# Patient Record
Sex: Male | Born: 1955
Health system: Southern US, Community
[De-identification: ages and names within clinical notes are randomized; demographics above are authoritative.]

## PROBLEM LIST (undated history)

## (undated) DIAGNOSIS — K229 Disease of esophagus, unspecified: Secondary | ICD-10-CM

## (undated) DIAGNOSIS — C801 Malignant (primary) neoplasm, unspecified: Secondary | ICD-10-CM

## (undated) DIAGNOSIS — I10 Essential (primary) hypertension: Secondary | ICD-10-CM

## (undated) DIAGNOSIS — E7800 Pure hypercholesterolemia, unspecified: Secondary | ICD-10-CM

## (undated) DIAGNOSIS — G4733 Obstructive sleep apnea (adult) (pediatric): Secondary | ICD-10-CM

## (undated) DIAGNOSIS — N400 Enlarged prostate without lower urinary tract symptoms: Secondary | ICD-10-CM

## (undated) DIAGNOSIS — M779 Enthesopathy, unspecified: Secondary | ICD-10-CM

## (undated) DIAGNOSIS — E669 Obesity, unspecified: Secondary | ICD-10-CM

## (undated) HISTORY — DX: Obesity, unspecified: E66.9

## (undated) HISTORY — DX: Pure hypercholesterolemia, unspecified: E78.00

## (undated) HISTORY — DX: Benign prostatic hyperplasia without lower urinary tract symptoms: N40.0

## (undated) HISTORY — DX: Enthesopathy, unspecified: M77.9

## (undated) HISTORY — DX: Obstructive sleep apnea (adult) (pediatric): G47.33

## (undated) HISTORY — PX: EYE SURGERY: SHX253

## (undated) HISTORY — PX: LUNG CANCER SURGERY: SHX702

## (undated) HISTORY — DX: Essential (primary) hypertension: I10

---

## 2000-09-14 HISTORY — PX: LASIK: SHX215

## 2008-02-10 ENCOUNTER — Ambulatory Visit: Payer: Self-pay | Admitting: Unknown Physician Specialty

## 2008-02-10 DIAGNOSIS — K229 Disease of esophagus, unspecified: Secondary | ICD-10-CM

## 2008-02-10 HISTORY — DX: Disease of esophagus, unspecified: K22.9

## 2011-09-15 DIAGNOSIS — C801 Malignant (primary) neoplasm, unspecified: Secondary | ICD-10-CM

## 2011-09-15 HISTORY — DX: Malignant (primary) neoplasm, unspecified: C80.1

## 2011-11-16 ENCOUNTER — Ambulatory Visit: Payer: Self-pay | Admitting: Family Medicine

## 2011-12-15 ENCOUNTER — Ambulatory Visit: Payer: Self-pay | Admitting: Family Medicine

## 2011-12-23 ENCOUNTER — Ambulatory Visit: Payer: Self-pay | Admitting: Family Medicine

## 2011-12-23 LAB — CREATININE, SERUM
Creatinine: 0.74 mg/dL (ref 0.60–1.30)
EGFR (African American): >60
EGFR (Non-African Amer.): >60

## 2012-01-12 ENCOUNTER — Encounter: Payer: Self-pay | Admitting: Internal Medicine

## 2012-01-13 ENCOUNTER — Ambulatory Visit (INDEPENDENT_AMBULATORY_CARE_PROVIDER_SITE_OTHER): Payer: BC Managed Care – PPO | Admitting: Internal Medicine

## 2012-01-13 ENCOUNTER — Encounter: Payer: Self-pay | Admitting: Internal Medicine

## 2012-01-13 VITALS — BP 110/76 | HR 62 | Temp 97.9°F | Ht 68.0 in | Wt 228.4 lb

## 2012-01-13 DIAGNOSIS — R918 Other nonspecific abnormal finding of lung field: Secondary | ICD-10-CM | POA: Insufficient documentation

## 2012-01-13 DIAGNOSIS — J9819 Other pulmonary collapse: Secondary | ICD-10-CM

## 2012-01-13 DIAGNOSIS — I10 Essential (primary) hypertension: Secondary | ICD-10-CM

## 2012-01-13 MED ORDER — AMLODIPINE-OLMESARTAN 5-20 MG PO TABS: 1.0000 | ORAL_TABLET | Freq: Every day | ORAL | Status: DC

## 2012-01-13 NOTE — Patient Instructions (Signed)
Stop lotrel and start azor 5/20 one daily  May 14 come to outpatient registration at [facility]  for bronchoscopy for the Right middle lobe syndrome evaluaiton.  Nothing to eat or drink after midnight Monday the 13th

## 2012-01-13 NOTE — Assessment & Plan Note (Signed)
ACE inhibitors are problematic in  pts with airway complaints because  even experienced pulmonologists can't always distinguish ace effects from copd/asthma.  By themselves they don't actually cause a problem, much like oxygen can't by itself start a fire, but they certainly serve as a powerful catalyst or enhancer for any "fire"  or inflammatory process in the upper airway, be it caused by an ET  tube or more commonly reflux (especially in the obese or pts with known GERD or who are on biphoshonates).    In the era of ARB near equivalency until we have a better handle on the reversibility of the airway problem, it just makes sense to avoid ACEI  entirely in the short run and then decide later, having established a level of airway control using a reasonable limited regimen, whether to add back ace but even then being very careful to observe the pt for worsening airway control and number of meds used/ needed to control symptoms.    Will give samples of azor 5/20 for full 6 week trial off to see if symptoms of persistent cough resolve and if so return to Dr Sullivan Lone for Rx of hbp longterm - if not will need f/u here acknowledging that chronic cough is often simultaneously caused by more than one condition. A single cause has been found from 38 to 82% of the time, multiple causes from 18 to 62%. Multiply caused cough has been the result of three diseases up to 42% of the time.

## 2012-01-13 NOTE — Assessment & Plan Note (Signed)
-   See CT () 12/23/11   - FOB 01/26/12 >>>  Fairly classic story for RML syndrome with cxr /ct that may never normalize and date all the way back to early 2000's with prev walking pna, though those xrays may not be avail at this point  Discussed in detail all the  indications, usual  risks and alternatives  relative to the benefits with patient who agrees to proceed with bronchoscopy to explore the anatomy of the rml, but assured pt that in most never smokers the cause for this is completely benign.

## 2012-01-13 NOTE — Progress Notes (Signed)
  Subjective:    Patient ID: Harry Padilla, male    DOB: 10-15-1955   MRN: [id number]  HPI  32 yom never smoker ? exposed to asbestos at [employer] [facility] in 90s  referred by Dr Sullivan Lone 01/13/2012 to pulmonary clinic with abn ct chest.  01/13/2012 1st pulmonary eval  had "walking pna" around early 2000s, 100% better with no cxr's since, then acute onset cough/ congestion  around 1st Jan 2013 assoc transient hemoptysis then just clear mucus mostly daytime with minimal doe,  then 1st of March got abx > no real change. In symptoms so got cxr > ct c/w RML process so referred here.  No obvious daytime variabilty or assoc  cp or chest tightness, subjective wheeze overt sinus or hb symptoms. No unusual exp hx (was not actually exposed to any asbestos until the 90s and never in a powder/dust  form) or h/o childhood pna/ asthma or premature birth to his knowledge.   Sleeping ok without nocturnal  or early am exacerbation  of respiratory  c/o's or need for noct saba. Also denies any obvious fluctuation of symptoms with weather or environmental changes or other aggravating or alleviating factors except as outlined above   Review of Systems  Constitutional: Negative for fever, chills, activity change, appetite change and unexpected weight change.  HENT: Positive for congestion and trouble swallowing. Negative for sore throat, rhinorrhea, sneezing, dental problem, voice change and postnasal drip.   Eyes: Negative for visual disturbance.  Respiratory: Positive for cough and shortness of breath. Negative for choking.   Cardiovascular: Negative for chest pain and leg swelling.  Gastrointestinal: Negative for nausea, vomiting and abdominal pain.  Genitourinary: Negative for difficulty urinating.  Musculoskeletal: Negative for arthralgias.  Skin: Negative for rash.  Psychiatric/Behavioral: Negative for behavioral problems and confusion.       Objective:   Physical Exam  Pleasant amb wm nad Wt 228 01/13/2012  HEENT:  nl dentition, turbinates, and orophanx. Nl external ear canals without cough reflex   NECK :  without JVD/Nodes/TM/ nl carotid upstrokes bilaterally   LUNGS: no acc muscle use, clear to A and P bilaterally without cough on insp or exp maneuvers   CV:  RRR  no s3 or murmur or increase in P2, no edema   ABD:  soft and nontender with nl excursion in the supine position. No bruits or organomegaly, bowel sounds nl  MS:  warm without deformities, calf tenderness, cyanosis or clubbing  SKIN: warm and dry without lesions    NEURO:  alert, approp, no deficits   12/23/11 CT Mild r hilar fullness, ? partially obstr RML     Assessment & Plan:

## 2012-01-25 ENCOUNTER — Telehealth: Payer: Self-pay | Admitting: Internal Medicine

## 2012-01-25 NOTE — Telephone Encounter (Signed)
Dr. Sherene Sires according to ov note instructions the pt needed a bronch and you advised the following: May 14 come to outpatient registration at [facility] for bronchoscopy for the Right middle lobe syndrome evaluaiton. Nothing to eat or drink after midnight Monday the 13th, however I called [facility] and spoke with Marcelino Duster to check on the time and they do not have this scheduled. Marcelino Duster states they do have availability tomorrow AM if you would still like to do bronch. Please advise. Carron Curie, CMA

## 2012-01-25 NOTE — Telephone Encounter (Signed)
I spoke with pt and is aware of MW response. He voiced his understanding and is aware he will be called with instructions about bronch tomorrow once MW gets the time set up.

## 2012-01-25 NOTE — Telephone Encounter (Signed)
Yes, my error, I didn't call them but I will call [facility]on Jester and let pt know we'll proceed as per instructions

## 2012-01-26 ENCOUNTER — Encounter (HOSPITAL_COMMUNITY)
Admission: RE | Disposition: A | Discharge status: Home / Self Care | Payer: Self-pay | Source: Ambulatory Visit | Attending: Internal Medicine

## 2012-01-26 ENCOUNTER — Ambulatory Visit (HOSPITAL_COMMUNITY)
Admission: RE | Admit: 2012-01-26 | Discharge: 2012-01-26 | Disposition: A | Discharge status: Home / Self Care | Payer: BC Managed Care – PPO | Source: Ambulatory Visit | Attending: Internal Medicine | Admitting: Internal Medicine

## 2012-01-26 ENCOUNTER — Other Ambulatory Visit: Payer: Self-pay | Admitting: Internal Medicine

## 2012-01-26 ENCOUNTER — Encounter (HOSPITAL_COMMUNITY): Payer: Self-pay | Admitting: Internal Medicine

## 2012-01-26 DIAGNOSIS — R222 Localized swelling, mass and lump, trunk: Secondary | ICD-10-CM | POA: Insufficient documentation

## 2012-01-26 DIAGNOSIS — R918 Other nonspecific abnormal finding of lung field: Secondary | ICD-10-CM

## 2012-01-26 DIAGNOSIS — R042 Hemoptysis: Secondary | ICD-10-CM | POA: Insufficient documentation

## 2012-01-26 HISTORY — PX: VIDEO BRONCHOSCOPY: SHX5072

## 2012-01-26 SURGERY — VIDEO BRONCHOSCOPY WITHOUT FLUORO
Anesthesia: Moderate Sedation | Laterality: Bilateral

## 2012-01-26 MED ORDER — MIDAZOLAM HCL 10 MG/2ML IJ SOLN
INTRAMUSCULAR | Status: AC
  Filled 2012-01-26: qty 4

## 2012-01-26 MED ORDER — PHENYLEPHRINE HCL 0.25 % NA SOLN
NASAL | Status: DC | PRN
  Administered 2012-01-26: 2 via NASAL

## 2012-01-26 MED ORDER — MEPERIDINE HCL 100 MG/ML IJ SOLN: 100.0000 mg | Freq: Once | INTRAMUSCULAR | Status: DC

## 2012-01-26 MED ORDER — PHENYLEPHRINE HCL 0.25 % NA SOLN
1.0000 | Freq: Four times a day (QID) | NASAL | Status: DC | PRN
  Filled 2012-01-26: qty 15

## 2012-01-26 MED ORDER — LIDOCAINE HCL 2 % EX GEL: Freq: Once | CUTANEOUS | Status: DC

## 2012-01-26 MED ORDER — MEPERIDINE HCL 100 MG/ML IJ SOLN
INTRAMUSCULAR | Status: AC
  Filled 2012-01-26: qty 2

## 2012-01-26 MED ORDER — LIDOCAINE HCL 1 % IJ SOLN
INTRAMUSCULAR | Status: DC | PRN
  Administered 2012-01-26: 6 mL via RESPIRATORY_TRACT

## 2012-01-26 MED ORDER — MIDAZOLAM HCL 10 MG/2ML IJ SOLN: 10.0000 mg | Freq: Once | INTRAMUSCULAR | Status: DC

## 2012-01-26 MED ORDER — MIDAZOLAM HCL 10 MG/2ML IJ SOLN
INTRAMUSCULAR | Status: DC | PRN
  Administered 2012-01-26 (×2): 2.5 mg via INTRAVENOUS

## 2012-01-26 MED ORDER — SODIUM CHLORIDE 0.9 % IV SOLN: INTRAVENOUS | Status: DC

## 2012-01-26 MED ORDER — MEPERIDINE HCL 25 MG/ML IJ SOLN
INTRAMUSCULAR | Status: DC | PRN
  Administered 2012-01-26: 50 mg via INTRAVENOUS

## 2012-01-26 MED ORDER — LIDOCAINE HCL 2 % EX GEL
CUTANEOUS | Status: DC | PRN
  Administered 2012-01-26: 1

## 2012-01-26 NOTE — Progress Notes (Signed)
Pt placed on RA for transport to Recovery area at 0903  O2 sats at 0904 was 93%

## 2012-01-26 NOTE — Progress Notes (Signed)
Video Bronchoscopy  05/14/22013  Intervention Bronchial Washing

## 2012-01-26 NOTE — H&P (Signed)
26 yom never smoker ? exposed to asbestos at [employer] in 90s referred by Dr Sullivan Lone 01/13/2012 to pulmonary clinic with abn ct chest.  01/13/2012 1st pulmonary eval had "walking pna" around early 2000s, 100% better with no cxr's since, then acute onset cough/ congestion around 1st Jan 2013 assoc transient hemoptysis then just clear mucus mostly daytime with minimal doe, then 1st of March got abx > no real change. In symptoms so got cxr > ct c/w RML process so referred here.  No obvious daytime variabilty or assoc cp or chest tightness, subjective wheeze overt sinus or hb symptoms. No unusual exp hx (was not actually exposed to any asbestos until the 90s and never in a powder/dust form) or h/o childhood pna/ asthma or premature birth to his knowledge.  Sleeping ok without nocturnal or early am exacerbation of respiratory c/o's or need for noct saba. Also denies any obvious fluctuation of symptoms with weather or environmental changes or other aggravating or alleviating factors except as outlined above  Review of Systems  Constitutional: Negative for fever, chills, activity change, appetite change and unexpected weight change.  HENT: Positive for congestion and trouble swallowing. Negative for sore throat, rhinorrhea, sneezing, dental problem, voice change and postnasal drip.  Eyes: Negative for visual disturbance.  Respiratory: Positive for cough and shortness of breath. Negative for choking.  Cardiovascular: Negative for chest pain and leg swelling.  Gastrointestinal: Negative for nausea, vomiting and abdominal pain.  Genitourinary: Negative for difficulty urinating.  Musculoskeletal: Negative for arthralgias.  Skin: Negative for rash.  Psychiatric/Behavioral: Negative for behavioral problems and confusion.      Objective:    Physical Exam  Pleasant amb wm nad  Wt 228 01/13/2012  HEENT: nl dentition, turbinates, and orophanx. Nl external ear canals without cough reflex  NECK : without  JVD/Nodes/TM/ nl carotid upstrokes bilaterally  LUNGS: no acc muscle use, clear to A and P bilaterally without cough on insp or exp maneuvers  CV: RRR no s3 or murmur or increase in P2, no edema  ABD: soft and nontender with nl excursion in the supine position. No bruits or organomegaly, bowel sounds nl  MS: warm without deformities, calf tenderness, cyanosis or clubbing  SKIN: warm and dry without lesions  NEURO: alert, approp, no deficits  12/23/11 CT  Mild r hilar fullness, ? partially obstr RML     Assessment & Plan:     Previous Version      Right middle lobe syndrome - Sandrea Hughs, MD 01/13/2012 11:51 PM Signed  - See CT ([place]) 12/23/11  - FOB 01/26/12 >>>  Fairly classic story for RML syndrome with cxr /ct that may never normalize and date all the way back to early 2000's with prev walking pna, though those xrays may not be avail at this point  Discussed in detail all the indications, usual risks and alternatives relative to the benefits with patient who agrees to proceed with bronchoscopy to explore the anatomy of the rml, but assured pt that in most never smokers the cause for this is completely benign.  HBP (high blood pressure) - Sandrea Hughs, MD 01/13/2012 11:54 PM Signed  ACE inhibitors are problematic in pts with airway complaints because even experienced pulmonologists can't always distinguish ace effects from copd/asthma. By themselves they don't actually cause a problem, much like oxygen can't by itself start a fire, but they certainly serve as a powerful catalyst or enhancer for any "fire" or inflammatory process in the upper airway, be it  caused by an ET tube or more commonly reflux (especially in the obese or pts with known GERD or who are on biphoshonates).  In the era of ARB near equivalency until we have a better handle on the reversibility of the airway problem, it just makes sense to avoid ACEI entirely in the short run and then decide later, having established a level of  airway control using a reasonable limited regimen, whether to add back ace but even then being very careful to observe the pt for worsening airway control and number of meds used/ needed to control symptoms.  Will give samples of azor 5/20 for full 6 week trial off to see if symptoms of persistent cough resolve and if so return to Dr Sullivan Lone for Rx of hbp longterm - if not will need f/u here acknowledging that chronic cough is often simultaneously caused by more than one condition. A single cause has been found from 38 to 82% of the time, multiple causes from 18 to 62%. Multiply caused cough has been the result of three diseases up to 42% of the time.       Electronic signature on 01/13/2012          Not recorded            Medications Ordered This Encounter         Disp  Refills  Start  End    amLODipine-olmesartan (AZOR) 5-20 MG per tablet  30 tablet  11  01/13/2012      Take 1 tablet by mouth daily. - Oral             Discontinued Medications         Reason for Discontinue    amLODipine-benazepril (LOTREL) 5-40 MG per capsule                   Patient Instructions     Stop lotrel and start azor 5/20 one daily  May 14 come to outpatient registration at [facility] for bronchoscopy for the Right middle lobe syndrome evaluaiton. Nothing to eat or drink after midnight Monday the 13th

## 2012-01-26 NOTE — Discharge Instructions (Signed)
Bronchoscopy Care After These instructions give you information on caring for yourself after your procedure. Your doctor may also give you specific instructions. Call your doctor if you have any problems or questions after your procedure. HOME CARE  Do not eat or drink anything for 2 hours after your test. Your nose and throat was numbed by medicine. If you try to eat or drink before the medicine wears off, food or drink could go into your lungs.   For the rest of the first day, eat soft food and drink liquids slowly.   On the day after the test, you can go back to eating your usual food.   Do not drive or sign important papers the day of the test.   Take it easy for the next 2 days. Do not do any heavy work, exercise, or activities.   Only take medicine as told by your doctor. Do not take aspirin.   You may be drowsy for the next 24 hours.   You may see traces of blood in your spit for 1 to 2 days.  Finding out the results of your test Ask when your test results will be ready. Make sure you get your test results. GET HELP RIGHT AWAY IF:  You have breathing problems.   You have a bad sore throat for more than 1 week.   You see traces of blood in your spit for more than 3 days.   You start coughing up blood.   You have a temperature of 102 F (38.9 C) or higher.  MAKE SURE YOU:  Understand these instructions.   Will watch your condition.   Will get help right away if you are not doing well or get worse.  Document Released: 06/28/2009 Document Revised: 08/20/2011 Document Reviewed: 06/28/2009 [facility] Patient Information 2012 [employer]  STOP ALL ASPIRIN PRODUCTS

## 2012-01-26 NOTE — Op Note (Signed)
Bronchoscopy Procedure Note  Date of Operation: 01/26/2012  Pre-op Diagnosis: rml syndrome  Post-op Diagnosis: RML mass  Surgeon: Sandrea Hughs  Anesthesia: Monitored Local Anesthesia with Sedation  Operation: Video Flexible fiberoptic bronchoscopy, diagnostic   Findings: RML vascular endobronchial mass  Specimen: Bronchial lavage  Estimated Blood Loss: less than 50   Complications: None  Indications and History: The patient is a 56 y.o. male with hemoptysis.  The risks, benefits, complications, treatment options and expected outcomes were discussed with the patient.  The possibilities of reaction to medication, pulmonary aspiration, perforation of a viscus, bleeding, failure to diagnose a condition and creating a complication requiring transfusion or operation were discussed with the patient who freely signed the consent.    Description of Procedure: The patient was re-examined in the bronchoscopy suite and the site of surgery properly noted/marked.  The patient was identified as Engineer, maintenance and the procedure verified as Flexible Fiberoptic Bronchoscopy.  A Time Out was held and the above information confirmed.   After the induction of topical nasopharyngeal anesthesia, the patient was positioned  and the bronchoscope was passed through the R naris. The vocal cords were visualized and  1% buffered lidocaine 5 ml was topically placed onto the cords. The cords were nl. The scope was then passed into the trachea.  1% buffered lidocaine 5 ml was used topically on the carina.  Careful inspection of the tracheal lumen was accomplished. The scope was sequentially passed into all the airways to the subsegmental level with nl findings x in the RML bronchus.   Endobronchial findings:  RML bronchus was completely occluded with a very vascular mass actively oozing so it was washed and the procedure terminated   The Patient was taken to the Endoscopy Recovery area in satisfactory  condition.  Attestation: I performed the procedure. Sandrea Hughs, MD Pulmonary and Critical Care Medicine [facility] Cell [phone]

## 2012-01-27 ENCOUNTER — Encounter (HOSPITAL_COMMUNITY): Payer: Self-pay | Admitting: Internal Medicine

## 2012-01-27 ENCOUNTER — Encounter: Payer: Self-pay | Admitting: Internal Medicine

## 2012-01-27 ENCOUNTER — Telehealth: Payer: Self-pay | Admitting: Internal Medicine

## 2012-01-27 NOTE — Telephone Encounter (Signed)
I spoke with Harry Padilla and she states pt was wanting to know if MW will be there when pet scan is done. I advised her it's done in radiology and report will be sent to [facility] for review. She voiced her understanding and needed nothing further

## 2012-02-01 ENCOUNTER — Encounter (HOSPITAL_COMMUNITY)
Admission: RE | Admit: 2012-02-01 | Discharge: 2012-02-01 | Disposition: A | Discharge status: Home / Self Care | Payer: BC Managed Care – PPO | Source: Ambulatory Visit | Attending: Internal Medicine | Admitting: Internal Medicine

## 2012-02-01 ENCOUNTER — Encounter: Payer: Self-pay | Admitting: Internal Medicine

## 2012-02-01 DIAGNOSIS — R222 Localized swelling, mass and lump, trunk: Secondary | ICD-10-CM | POA: Insufficient documentation

## 2012-02-01 DIAGNOSIS — R918 Other nonspecific abnormal finding of lung field: Secondary | ICD-10-CM

## 2012-02-01 MED ORDER — FLUDEOXYGLUCOSE F - 18 (FDG) INJECTION
19.2000 | Freq: Once | INTRAVENOUS | Status: AC | PRN
  Administered 2012-02-01: 19.2 via INTRAVENOUS

## 2012-02-02 ENCOUNTER — Other Ambulatory Visit: Payer: Self-pay | Admitting: Internal Medicine

## 2012-02-02 DIAGNOSIS — R918 Other nonspecific abnormal finding of lung field: Secondary | ICD-10-CM

## 2012-02-03 ENCOUNTER — Inpatient Hospital Stay (HOSPITAL_COMMUNITY): Admission: RE | Admit: 2012-02-03 | Discharge: 2012-02-03 | Payer: BC Managed Care – PPO | Source: Ambulatory Visit

## 2012-02-03 ENCOUNTER — Encounter: Payer: Self-pay | Admitting: Surgery

## 2012-02-03 ENCOUNTER — Other Ambulatory Visit: Payer: Self-pay

## 2012-02-03 ENCOUNTER — Encounter (HOSPITAL_COMMUNITY)
Admission: RE | Admit: 2012-02-03 | Discharge: 2012-02-03 | Disposition: A | Discharge status: Home / Self Care | Payer: BC Managed Care – PPO | Source: Ambulatory Visit | Attending: Surgery | Admitting: Surgery

## 2012-02-03 ENCOUNTER — Institutional Professional Consult (permissible substitution) (INDEPENDENT_AMBULATORY_CARE_PROVIDER_SITE_OTHER): Payer: BC Managed Care – PPO | Admitting: Surgery

## 2012-02-03 ENCOUNTER — Encounter (HOSPITAL_COMMUNITY): Payer: Self-pay | Admitting: Pharmacy Technician

## 2012-02-03 ENCOUNTER — Encounter (HOSPITAL_COMMUNITY): Payer: Self-pay

## 2012-02-03 VITALS — BP 148/91 | HR 72 | Resp 16 | Ht 68.0 in | Wt 225.0 lb

## 2012-02-03 VITALS — BP 156/90 | HR 87 | Temp 98.8°F | Resp 20 | Ht 68.0 in | Wt 228.6 lb

## 2012-02-03 DIAGNOSIS — D381 Neoplasm of uncertain behavior of trachea, bronchus and lung: Secondary | ICD-10-CM

## 2012-02-03 DIAGNOSIS — R042 Hemoptysis: Secondary | ICD-10-CM

## 2012-02-03 DIAGNOSIS — R918 Other nonspecific abnormal finding of lung field: Secondary | ICD-10-CM

## 2012-02-03 DIAGNOSIS — R222 Localized swelling, mass and lump, trunk: Secondary | ICD-10-CM

## 2012-02-03 LAB — URINALYSIS, ROUTINE W REFLEX MICROSCOPIC
Glucose, UA: NEGATIVE mg/dL
Hgb urine dipstick: NEGATIVE
Ketones, ur: NEGATIVE mg/dL
Leukocytes, UA: NEGATIVE
Nitrite: NEGATIVE
Urobilinogen, UA: 0.2 mg/dL (ref 0.0–1.0)
pH: 6 (ref 5.0–8.0)

## 2012-02-03 LAB — CBC
HCT: 40.8 % (ref 39.0–52.0)
Hemoglobin: 14.7 g/dL (ref 13.0–17.0)
MCH: 31.1 pg (ref 26.0–34.0)
MCV: 86.3 fL (ref 78.0–100.0)
RBC: 4.73 MIL/uL (ref 4.22–5.81)

## 2012-02-03 LAB — BLOOD GAS, ARTERIAL
Acid-Base Excess: 0.4 mmol/L (ref 0.0–2.0)
Bicarbonate: 23.8 mEq/L (ref 20.0–24.0)
O2 Saturation: 96 %
TCO2: 24.9 mmol/L (ref 0–100)
pO2, Arterial: 75.5 mmHg — ABNORMAL LOW (ref 80.0–100.0)

## 2012-02-03 LAB — PROTIME-INR: INR: 0.87 (ref 0.00–1.49)

## 2012-02-03 LAB — COMPREHENSIVE METABOLIC PANEL
ALT: 24 U/L (ref 0–53)
CO2: 22 mEq/L (ref 19–32)
Calcium: 9.5 mg/dL (ref 8.4–10.5)
Creatinine, Ser: 0.83 mg/dL (ref 0.50–1.35)
GFR calc Af Amer: >90 mL/min (ref >90)
GFR calc non Af Amer: >90 mL/min (ref >90)
Glucose, Bld: 96 mg/dL (ref 70–99)
Total Bilirubin: 0.3 mg/dL (ref 0.3–1.2)

## 2012-02-03 LAB — SURGICAL PCR SCREEN: Staphylococcus aureus: POSITIVE — AB

## 2012-02-03 LAB — TYPE AND SCREEN: ABO/RH(D): O POS

## 2012-02-03 LAB — APTT: aPTT: 27 seconds (ref 24–37)

## 2012-02-03 MED ORDER — VANCOMYCIN HCL 1000 MG IV SOLR
1500.0000 mg | INTRAVENOUS | Status: AC
  Administered 2012-02-04: 1500 mg via INTRAVENOUS
  Filled 2012-02-03: qty 1500

## 2012-02-03 MED ORDER — VANCOMYCIN HCL IN DEXTROSE 1-5 GM/200ML-% IV SOLN: 1000.0000 mg | INTRAVENOUS | Status: DC

## 2012-02-03 NOTE — Progress Notes (Signed)
                  [address]            ,[place] [address]          [phone]      PCP is GILBERT,RICHARD L, MD, MD Referring Provider is Wert, Michael B, MD    Chief Complaint   Patient presents with   .  Lung Mass       eval and treat...ct/[facility]...PET        HPI:   The patient is a 56-year-old healthy nonsmoker who reports developing hemoptysis in January 2013. He said that a chest x-ray showed a right lung density and he was treated with antibiotics for possible pneumonia. The chest x-ray findings did not improve although the hemoptysis stopped. He was continuing to have some cough and shortness of breath. Over the past couple weeks the hemoptysis has resumed. He said this typically occurs in the morning and he will cough up some blood clot followed by some bright red blood which then stops. He said this is more than just blood streaking. Bronchoscopy was performed by Dr. Wert and showed complete occlusion of the RML bronchus by a very vascular mass that was actively oozing.  Biopsy was not performed but washings were and showed no malignancy. He last coughed up some blood this am.    Past Medical History   Diagnosis  Date   .  Viral pneumonia     .  Enthesopathy     .  BPH (benign prostatic hyperplasia)     .  Hypercholesterolemia     .  Hypertension     .  Obesity     .  OSA (obstructive sleep apnea)           Past Surgical History   Procedure  Date   .  Lasik     .  Video bronchoscopy  01/26/2012       Procedure: VIDEO BRONCHOSCOPY WITHOUT FLUORO;  Surgeon: Michael B Wert, MD;  Location: [facility] ENDOSCOPY;  Service: Cardiopulmonary;  Laterality: Bilateral;         Family History   Problem  Relation  Age of Onset   .  Heart disease  Mother          Social History History   Substance Use Topics   .  Smoking status:  Never Smoker    .  Smokeless tobacco:  Never Used   .  Alcohol Use:  No         Current Outpatient Prescriptions     Medication  Sig  Dispense  Refill   .  amLODipine-olmesartan (AZOR) 5-20 MG per tablet  Take 1 tablet by mouth daily.   30 tablet   11   .  cholecalciferol (VITAMIN D) 400 UNITS TABS  Take 400 Units by mouth daily.         .  hydrocortisone cream 1 %  Apply 1 application topically 2 (two) times daily.         .  Loratadine 10 MG CAPS  Take 1 capsule by mouth daily as needed.         .  meloxicam (MOBIC) 15 MG tablet  Take 15 mg by mouth daily.         .  Multiple Vitamins-Minerals (MENS MULTIPLUS PO)  Take 1 tablet by mouth daily.         .    vitamin C (ASCORBIC ACID) 250 MG tablet  Take 250 mg by mouth daily.         .  vitamin E 400 UNIT capsule  Take 400 Units by mouth daily.             No current facility-administered medications for this visit.       Facility-Administered Medications Ordered in Other Visits   Medication  Dose  Route  Frequency  Provider  Last Rate  Last Dose   .  vancomycin (VANCOCIN) 1,500 mg in sodium chloride 0.9 % 500 mL IVPB   1,500 mg  Intravenous  60 min Pre-Op  Nyheem Binette K Maizy Davanzo, MD         .  DISCONTD: vancomycin (VANCOCIN) IVPB 1000 mg/200 mL premix   1,000 mg  Intravenous  60 min Pre-Op  Ellina Sivertsen K Tyshon Fanning, MD               Allergies   Allergen  Reactions   .  Penicillins         ? rash        Review of Systems  Constitutional: Negative.   HENT: Negative.   Eyes: Negative.   Respiratory: Positive for cough, shortness of breath and wheezing. Negative for choking, chest tightness and stridor.   Genitourinary: Negative.   Musculoskeletal: Negative.   Neurological: Negative.   Hematological: Negative.   Psychiatric/Behavioral: Negative.       BP 148/91  Pulse 72  Resp 16  Ht 5' 8" (1.727 m)  Wt 225 lb (102.059 kg)  BMI 34.21 kg/m2  SpO2 95% Physical Exam  Constitutional: He is oriented to person, place, and time. He appears well-developed and well-nourished.  HENT:   Head: Normocephalic and atraumatic.   Mouth/Throat: Oropharynx is clear  and moist. No oropharyngeal exudate.  Eyes: Conjunctivae and EOM are normal.  Neck: Normal range of motion. Neck supple. No JVD present. No tracheal deviation present. No thyromegaly present.  Cardiovascular: Normal rate, regular rhythm, normal heart sounds and intact distal pulses.  Exam reveals no gallop and no friction rub.    No murmur heard. Pulmonary/Chest: Effort normal. No stridor. No respiratory distress. He has wheezes.  Abdominal: Soft. Bowel sounds are normal. He exhibits no distension and no mass. There is no tenderness.  Musculoskeletal: Normal range of motion. He exhibits no edema.  Lymphadenopathy:    He has no cervical adenopathy.  Neurological: He is alert and oriented to person, place, and time. No cranial nerve deficit or sensory deficit.  Skin: Skin is warm and dry.  Psychiatric: He has a normal mood and affect.        Diagnostic Tests:     *RADIOLOGY REPORT*   Clinical Data: Initial treatment strategy for right middle lobe lung mass.  Biopsy on 01/26/2012 demonstrating "acute inflammation and reactive bronchial epithelial cells."   NUCLEAR MEDICINE PET CT SKULL BASE TO THIGH   Technique:  Technique:  19.2 mCi F-18 FDG was injected intravenously. CT data was obtained and used for attenuation correction and anatomic localization only.  (This was not acquired as a diagnostic CT examination.) Additional exam technical data entered on technologist worksheet.   Comparison: Outside report of a chest CT dated 12/23/2011.   Findings: Neck: No abnormal hypermetabolism.   Chest:  No abnormal activity identified.  Specifically, in the region of the right middle lobe bronchus and partially collapsed posterior right middle lobe, there are no focal areas of hypermetabolism to suggest FDG avid primary neoplasm.    No abnormal nodal activity.   Abdomen/Pelvis:  No abnormal hypermetabolism.   [place]:  No abnormal hypermetabolism.   CT images performed for  attenuation correction demonstrate mucous retention cyst or polyp in the left maxillary sinus.  Small posterior triangle nodes.   LAD coronary artery atherosclerosis on image 94.   Posterior right middle lobe volume loss without well-defined mass. No definite endobronchial lesion.  Narrowing/partial collapse of the right middle lobe bronchus on image 95.   Scattered nonspecific parenchymal calcifications within the right kidney.  Most likely associated with minimally complex cysts.   7 mm interpolar left renal hyperattenuating lesion on image 157. Given marked hyperattenuation, at 88 HU, most consistent with a hemorrhagic / proteinaceous cyst.   Mild hepatic steatosis.  Mild prostatomegaly.  Scattered sclerotic lesions, likely bone islands.   IMPRESSION:   1.  No hypermetabolism to correspond to volume loss in the posterior right middle lobe or partial collapse / narrowing of the right middle lobe bronchus.  Favor infectious or inflammatory process. Consider follow-up chest CT of 3 months to exclude underlying occult non FDG avid lesion such as carcinoid. 2. Multivessel coronary artery atherosclerosis.   Original Report Authenticated By: KYLE D. TALBOT, M.D.     Impression:   He has an obstructing RML bronial lesion that is causing significant hemoptysis.  This could be a benign inflammatory or infectious processs or a carcinoid. With significant hemoptysis I have recommended surgical treatment wiith right middle lobectomy. This lobe will have to come out even if there is no cancer due to the chronic bronchial obstruction. I discussed the surgery, alternatives, benefits and risks with the patient and his wife and they agree to proceed. I also discussed the possibility that we may need to perform a bilobectomy to get a negative bronchial margin.   Plan:   Flexible fiberoptic bronchoscopy and right middle lobectomy in the am tomorrow.      

## 2012-02-03 NOTE — Pre-Procedure Instructions (Signed)
20 Ed Croll  02/03/2012   Your procedure is scheduled on:  Thurs, May 23 @ 10:25 AM  Report to [facility] at 7:30 AM.  Call this number if you have problems the morning of surgery: [phone]   Remember:   Do not eat food:After Midnight  May have clear liquids: up to 4 Hours before arrival.(until 3:30 AM)  Clear liquids include soda, tea, black coffee, apple or grape juice, broth,water  Take these medicines the morning of surgery with A SIP OF WATER: Loratadine   Do not wear jewelry  Do not wear lotions, powders, or cologne   Men may shave face and neck.  Do not bring valuables to the hospital.  Contacts, dentures or bridgework may not be worn into surgery.  Leave suitcase in the car. After surgery it may be brought to your room.  For patients admitted to the hospital, checkout time is 11:00 AM the day of discharge.   Special Instructions: CHG Shower Use Special Wash: 1/2 bottle night before surgery and 1/2 bottle morning of surgery.   Please read over the following fact sheets that you were given: Pain Booklet, Coughing and Deep Breathing, Blood Transfusion Information, MRSA Information and Surgical Site Infection Prevention

## 2012-02-03 NOTE — Pre-Procedure Instructions (Signed)
20 Ed Klinger  02/03/2012   Your procedure is scheduled on:  02/04/12  Report to [facility] at 730 AM.  Call this number if you have problems the morning of surgery: [phone]   Remember:   Do not eat food:After Midnight.  May have clear liquids: up to 4 Hours before arrival.  Clear liquids include soda, tea, black coffee, apple or grape juice, broth.  Take these medicines the morning of surgery with A SIP OF WATER: amlodipine   Do not wear jewelry, make-up or nail polish.  Do not wear lotions, powders, or perfumes. You may wear deodorant.  Do not shave 48 hours prior to surgery. Men may shave face and neck.  Do not bring valuables to the hospital.  Contacts, dentures or bridgework may not be worn into surgery.  Leave suitcase in the car. After surgery it may be brought to your room.  For patients admitted to the hospital, checkout time is 11:00 AM the day of discharge.   Patients discharged the day of surgery will not be allowed to drive home.  Name and phone number of your driver: wife  Special Instructions: CHG Shower Use Special Wash: 1/2 bottle night before surgery and 1/2 bottle morning of surgery.   Please read over the following fact sheets that you were given: Pain Booklet, Coughing and Deep Breathing, Blood Transfusion Information, MRSA Information and Surgical Site Infection Prevention

## 2012-02-03 NOTE — H&P (Signed)
[address]            [place] [address]          [phone]      PCP is Bosie Clos, MD, MD Referring Provider is Nyoka Cowden, MD    Chief Complaint   Patient presents with   .  Lung Mass       eval and treat...ct/[facility].Marland KitchenMarland KitchenPET        HPI:   The patient is a 56 year old healthy nonsmoker who reports developing hemoptysis in January 2013. He said that a chest x-ray showed a right lung density and he was treated with antibiotics for possible pneumonia. The chest x-ray findings did not improve although the hemoptysis stopped. He was continuing to have some cough and shortness of breath. Over the past couple weeks the hemoptysis has resumed. He said this typically occurs in the morning and he will cough up some blood clot followed by some bright red blood which then stops. He said this is more than just blood streaking. Bronchoscopy was performed by Dr. Sherene Sires and showed complete occlusion of the RML bronchus by a very vascular mass that was actively oozing.  Biopsy was not performed but washings were and showed no malignancy. He last coughed up some blood this am.    Past Medical History   Diagnosis  Date   .  Viral pneumonia     .  Enthesopathy     .  BPH (benign prostatic hyperplasia)     .  Hypercholesterolemia     .  Hypertension     .  Obesity     .  OSA (obstructive sleep apnea)           Past Surgical History   Procedure  Date   .  Lasik     .  Video bronchoscopy  01/26/2012       Procedure: VIDEO BRONCHOSCOPY WITHOUT FLUORO;  Surgeon: Nyoka Cowden, MD;  Location: [facility];  Service: Cardiopulmonary;  Laterality: Bilateral;         Family History   Problem  Relation  Age of Onset   .  Heart disease  Mother          Social History History   Substance Use Topics   .  Smoking status:  Never Smoker    .  Smokeless tobacco:  Never Used   .  Alcohol Use:  No         Current Outpatient Prescriptions     Medication  Sig  Dispense  Refill   .  amLODipine-olmesartan (AZOR) 5-20 MG per tablet  Take 1 tablet by mouth daily.   30 tablet   11   .  cholecalciferol (VITAMIN D) 400 UNITS TABS  Take 400 Units by mouth daily.         .  hydrocortisone cream 1 %  Apply 1 application topically 2 (two) times daily.         .  Loratadine 10 MG CAPS  Take 1 capsule by mouth daily as needed.         .  meloxicam (MOBIC) 15 MG tablet  Take 15 mg by mouth daily.         .  Multiple Vitamins-Minerals (MENS MULTIPLUS PO)  Take 1 tablet by mouth daily.         Marland Kitchen  vitamin C (ASCORBIC ACID) 250 MG tablet  Take 250 mg by mouth daily.         .  vitamin E 400 UNIT capsule  Take 400 Units by mouth daily.             No current facility-administered medications for this visit.       Facility-Administered Medications Ordered in Other Visits   Medication  Dose  Route  Frequency  Provider  Last Rate  Last Dose   .  vancomycin (VANCOCIN) 1,500 mg in sodium chloride 0.9 % 500 mL IVPB   1,500 mg  Intravenous  60 min Pre-Op  Alleen Borne, MD         .  DISCONTD: vancomycin (VANCOCIN) IVPB 1000 mg/200 mL premix   1,000 mg  Intravenous  60 min Pre-Op  Alleen Borne, MD               Allergies   Allergen  Reactions   .  Penicillins         ? rash        Review of Systems  Constitutional: Negative.   HENT: Negative.   Eyes: Negative.   Respiratory: Positive for cough, shortness of breath and wheezing. Negative for choking, chest tightness and stridor.   Genitourinary: Negative.   Musculoskeletal: Negative.   Neurological: Negative.   Hematological: Negative.   Psychiatric/Behavioral: Negative.       BP 148/91  Pulse 72  Resp 16  Ht 5\' 8"  (1.727 m)  Wt 225 lb (102.059 kg)  BMI 34.21 kg/m2  SpO2 95% Physical Exam  Constitutional: He is oriented to person, place, and time. He appears well-developed and well-nourished.  HENT:   Head: Normocephalic and atraumatic.   Mouth/Throat: Oropharynx is clear  and moist. No oropharyngeal exudate.  Eyes: Conjunctivae and EOM are normal.  Neck: Normal range of motion. Neck supple. No JVD present. No tracheal deviation present. No thyromegaly present.  Cardiovascular: Normal rate, regular rhythm, normal heart sounds and intact distal pulses.  Exam reveals no gallop and no friction rub.    No murmur heard. Pulmonary/Chest: Effort normal. No stridor. No respiratory distress. He has wheezes.  Abdominal: Soft. Bowel sounds are normal. He exhibits no distension and no mass. There is no tenderness.  Musculoskeletal: Normal range of motion. He exhibits no edema.  Lymphadenopathy:    He has no cervical adenopathy.  Neurological: He is alert and oriented to person, place, and time. No cranial nerve deficit or sensory deficit.  Skin: Skin is warm and dry.  Psychiatric: He has a normal mood and affect.        Diagnostic Tests:     *RADIOLOGY REPORT*   Clinical Data: Initial treatment strategy for right middle lobe lung mass.  Biopsy on 01/26/2012 demonstrating "acute inflammation and reactive bronchial epithelial cells."   NUCLEAR MEDICINE PET CT SKULL BASE TO THIGH   Technique:  Technique:  19.2 mCi F-18 FDG was injected intravenously. CT data was obtained and used for attenuation correction and anatomic localization only.  (This was not acquired as a diagnostic CT examination.) Additional exam technical data entered on technologist worksheet.   Comparison: Outside report of a chest CT dated 12/23/2011.   Findings: Neck: No abnormal hypermetabolism.   Chest:  No abnormal activity identified.  Specifically, in the region of the right middle lobe bronchus and partially collapsed posterior right middle lobe, there are no focal areas of hypermetabolism to suggest FDG avid primary neoplasm.  No abnormal nodal activity.   Abdomen/Pelvis:  No abnormal hypermetabolism.   [place]:  No abnormal hypermetabolism.   CT images performed for  attenuation correction demonstrate mucous retention cyst or polyp in the left maxillary sinus.  Small posterior triangle nodes.   LAD coronary artery atherosclerosis on image 94.   Posterior right middle lobe volume loss without well-defined mass. No definite endobronchial lesion.  Narrowing/partial collapse of the right middle lobe bronchus on image 95.   Scattered nonspecific parenchymal calcifications within the right kidney.  Most likely associated with minimally complex cysts.   7 mm interpolar left renal hyperattenuating lesion on image 157. Given marked hyperattenuation, at 88 HU, most consistent with a hemorrhagic / proteinaceous cyst.   Mild hepatic steatosis.  Mild prostatomegaly.  Scattered sclerotic lesions, likely bone islands.   IMPRESSION:   1.  No hypermetabolism to correspond to volume loss in the posterior right middle lobe or partial collapse / narrowing of the right middle lobe bronchus.  Favor infectious or inflammatory process. Consider follow-up chest CT of 3 months to exclude underlying occult non FDG avid lesion such as carcinoid. 2. Multivessel coronary artery atherosclerosis.   Original Report Authenticated By: Consuello Bossier, M.D.     Impression:   He has an obstructing RML bronial lesion that is causing significant hemoptysis.  This could be a benign inflammatory or infectious processs or a carcinoid. With significant hemoptysis I have recommended surgical treatment wiith right middle lobectomy. This lobe will have to come out even if there is no cancer due to the chronic bronchial obstruction. I discussed the surgery, alternatives, benefits and risks with the patient and his wife and they agree to proceed. I also discussed the possibility that we may need to perform a bilobectomy to get a negative bronchial margin.   Plan:   Flexible fiberoptic bronchoscopy and right middle lobectomy in the am tomorrow.

## 2012-02-04 ENCOUNTER — Encounter (HOSPITAL_COMMUNITY): Payer: Self-pay | Admitting: *Deleted

## 2012-02-04 ENCOUNTER — Inpatient Hospital Stay (HOSPITAL_COMMUNITY)
Admission: RE | Admit: 2012-02-04 | Discharge: 2012-02-10 | DRG: 538 | Disposition: A | Discharge status: Home / Self Care | Payer: BC Managed Care – PPO | Source: Ambulatory Visit | Attending: Surgery | Admitting: Surgery

## 2012-02-04 ENCOUNTER — Inpatient Hospital Stay (HOSPITAL_COMMUNITY): Payer: BC Managed Care – PPO

## 2012-02-04 ENCOUNTER — Ambulatory Visit (HOSPITAL_COMMUNITY): Payer: BC Managed Care – PPO | Admitting: *Deleted

## 2012-02-04 ENCOUNTER — Encounter (HOSPITAL_COMMUNITY)
Admission: RE | Disposition: A | Discharge status: Home / Self Care | Payer: Self-pay | Source: Ambulatory Visit | Attending: Surgery

## 2012-02-04 ENCOUNTER — Telehealth: Payer: Self-pay | Admitting: Internal Medicine

## 2012-02-04 DIAGNOSIS — J129 Viral pneumonia, unspecified: Secondary | ICD-10-CM | POA: Diagnosis present

## 2012-02-04 DIAGNOSIS — N400 Enlarged prostate without lower urinary tract symptoms: Secondary | ICD-10-CM | POA: Diagnosis present

## 2012-02-04 DIAGNOSIS — G4733 Obstructive sleep apnea (adult) (pediatric): Secondary | ICD-10-CM | POA: Diagnosis present

## 2012-02-04 DIAGNOSIS — R222 Localized swelling, mass and lump, trunk: Principal | ICD-10-CM | POA: Diagnosis present

## 2012-02-04 DIAGNOSIS — R042 Hemoptysis: Secondary | ICD-10-CM

## 2012-02-04 DIAGNOSIS — E7800 Pure hypercholesterolemia, unspecified: Secondary | ICD-10-CM | POA: Diagnosis present

## 2012-02-04 DIAGNOSIS — D381 Neoplasm of uncertain behavior of trachea, bronchus and lung: Secondary | ICD-10-CM

## 2012-02-04 DIAGNOSIS — I10 Essential (primary) hypertension: Secondary | ICD-10-CM | POA: Diagnosis present

## 2012-02-04 DIAGNOSIS — E876 Hypokalemia: Secondary | ICD-10-CM | POA: Diagnosis not present

## 2012-02-04 DIAGNOSIS — E669 Obesity, unspecified: Secondary | ICD-10-CM | POA: Diagnosis present

## 2012-02-04 DIAGNOSIS — Z01812 Encounter for preprocedural laboratory examination: Secondary | ICD-10-CM

## 2012-02-04 DIAGNOSIS — J9383 Other pneumothorax: Secondary | ICD-10-CM | POA: Diagnosis not present

## 2012-02-04 HISTORY — PX: VIDEO BRONCHOSCOPY: SHX5072

## 2012-02-04 LAB — GLUCOSE, CAPILLARY

## 2012-02-04 SURGERY — LOBECTOMY, LUNG, OPEN
Anesthesia: General | Site: Chest | Laterality: Right | Wound class: Clean Contaminated

## 2012-02-04 MED ORDER — HEMOSTATIC AGENTS (NO CHARGE) OPTIME
TOPICAL | Status: DC | PRN
  Administered 2012-02-04: 1 via TOPICAL

## 2012-02-04 MED ORDER — NALOXONE HCL 0.4 MG/ML IJ SOLN: 0.4000 mg | INTRAMUSCULAR | Status: DC | PRN

## 2012-02-04 MED ORDER — GLYCOPYRROLATE 0.2 MG/ML IJ SOLN
INTRAMUSCULAR | Status: DC | PRN
  Administered 2012-02-04: 0.6 mg via INTRAVENOUS

## 2012-02-04 MED ORDER — MIDAZOLAM HCL 5 MG/5ML IJ SOLN
INTRAMUSCULAR | Status: DC | PRN
  Administered 2012-02-04: 1 mg via INTRAVENOUS

## 2012-02-04 MED ORDER — ROCURONIUM BROMIDE 100 MG/10ML IV SOLN
INTRAVENOUS | Status: DC | PRN
  Administered 2012-02-04: 50 mg via INTRAVENOUS

## 2012-02-04 MED ORDER — OXYCODONE HCL 5 MG PO TABS: 5.0000 mg | ORAL_TABLET | ORAL | Status: AC | PRN

## 2012-02-04 MED ORDER — VANCOMYCIN HCL IN DEXTROSE 1-5 GM/200ML-% IV SOLN
1000.0000 mg | Freq: Two times a day (BID) | INTRAVENOUS | Status: AC
  Administered 2012-02-04: 1000 mg via INTRAVENOUS
  Filled 2012-02-04: qty 200

## 2012-02-04 MED ORDER — BUPIVACAINE 0.5 % ON-Q PUMP SINGLE CATH 400 ML
400.0000 mL | INJECTION | Status: DC
  Filled 2012-02-04 (×2): qty 400

## 2012-02-04 MED ORDER — PROPOFOL 10 MG/ML IV EMUL
INTRAVENOUS | Status: DC | PRN
  Administered 2012-02-04: 200 mg via INTRAVENOUS
  Administered 2012-02-04: 50 mg via INTRAVENOUS
  Administered 2012-02-04: 200 mg via INTRAVENOUS

## 2012-02-04 MED ORDER — ACETAMINOPHEN 10 MG/ML IV SOLN
1000.0000 mg | Freq: Four times a day (QID) | INTRAVENOUS | Status: AC
  Administered 2012-02-04 – 2012-02-05 (×4): 1000 mg via INTRAVENOUS
  Filled 2012-02-04 (×4): qty 100

## 2012-02-04 MED ORDER — NEOSTIGMINE METHYLSULFATE 1 MG/ML IJ SOLN
INTRAMUSCULAR | Status: DC | PRN
  Administered 2012-02-04: 4 mg via INTRAVENOUS

## 2012-02-04 MED ORDER — SENNOSIDES-DOCUSATE SODIUM 8.6-50 MG PO TABS
1.0000 | ORAL_TABLET | Freq: Every evening | ORAL | Status: DC | PRN
  Filled 2012-02-04: qty 1

## 2012-02-04 MED ORDER — HYDROMORPHONE HCL PF 1 MG/ML IJ SOLN
0.2500 mg | INTRAMUSCULAR | Status: DC | PRN
  Administered 2012-02-04: 0.5 mg via INTRAVENOUS

## 2012-02-04 MED ORDER — FENTANYL CITRATE 0.05 MG/ML IJ SOLN: 50.0000 ug | INTRAMUSCULAR | Status: DC | PRN

## 2012-02-04 MED ORDER — INSULIN ASPART 100 UNIT/ML ~~LOC~~ SOLN
0.0000 [IU] | Freq: Four times a day (QID) | SUBCUTANEOUS | Status: DC
  Administered 2012-02-04 – 2012-02-05 (×3): 2 [IU] via SUBCUTANEOUS

## 2012-02-04 MED ORDER — TRAMADOL HCL 50 MG PO TABS: 50.0000 mg | ORAL_TABLET | Freq: Four times a day (QID) | ORAL | Status: DC | PRN

## 2012-02-04 MED ORDER — LACTATED RINGERS IV SOLN
INTRAVENOUS | Status: DC | PRN
Start: 2012-02-04 — End: 2012-02-04
  Administered 2012-02-04: 09:00:00 via INTRAVENOUS

## 2012-02-04 MED ORDER — KETOROLAC TROMETHAMINE 15 MG/ML IJ SOLN
15.0000 mg | Freq: Four times a day (QID) | INTRAMUSCULAR | Status: DC | PRN
  Administered 2012-02-04 – 2012-02-05 (×3): 15 mg via INTRAVENOUS
  Filled 2012-02-04 (×3): qty 1

## 2012-02-04 MED ORDER — MIDAZOLAM HCL 2 MG/2ML IJ SOLN: 1.0000 mg | INTRAMUSCULAR | Status: DC | PRN

## 2012-02-04 MED ORDER — BUPIVACAINE 0.5 % ON-Q PUMP SINGLE CATH 400 ML
INJECTION | Status: DC | PRN
  Administered 2012-02-04: 400 mL

## 2012-02-04 MED ORDER — PHENYLEPHRINE HCL 10 MG/ML IJ SOLN
10.0000 mg | INTRAVENOUS | Status: DC | PRN
  Administered 2012-02-04: 10 ug/min via INTRAVENOUS

## 2012-02-04 MED ORDER — FENTANYL CITRATE 0.05 MG/ML IJ SOLN
INTRAMUSCULAR | Status: DC | PRN
  Administered 2012-02-04: 100 ug via INTRAVENOUS
  Administered 2012-02-04: 50 ug via INTRAVENOUS
  Administered 2012-02-04: 100 ug via INTRAVENOUS
  Administered 2012-02-04: 50 ug via INTRAVENOUS
  Administered 2012-02-04: 100 ug via INTRAVENOUS
  Administered 2012-02-04 (×7): 50 ug via INTRAVENOUS

## 2012-02-04 MED ORDER — BUPIVACAINE ON-Q PAIN PUMP (FOR ORDER SET NO CHG)
INJECTION | Status: AC
  Filled 2012-02-04: qty 1

## 2012-02-04 MED ORDER — ONDANSETRON HCL 4 MG/2ML IJ SOLN: 4.0000 mg | Freq: Four times a day (QID) | INTRAMUSCULAR | Status: DC | PRN

## 2012-02-04 MED ORDER — HYDROMORPHONE HCL PF 1 MG/ML IJ SOLN
INTRAMUSCULAR | Status: AC
  Filled 2012-02-04: qty 1

## 2012-02-04 MED ORDER — EPHEDRINE SULFATE 50 MG/ML IJ SOLN
INTRAMUSCULAR | Status: DC | PRN
  Administered 2012-02-04: 5 mg via INTRAVENOUS

## 2012-02-04 MED ORDER — MOXIFLOXACIN HCL IN NACL 400 MG/250ML IV SOLN
400.0000 mg | INTRAVENOUS | Status: AC
  Administered 2012-02-04: 400 mg via INTRAVENOUS
  Filled 2012-02-04: qty 250

## 2012-02-04 MED ORDER — MUPIROCIN 2 % EX OINT
TOPICAL_OINTMENT | CUTANEOUS | Status: AC
  Administered 2012-02-04: 1 via NASAL
  Filled 2012-02-04: qty 22

## 2012-02-04 MED ORDER — OXYCODONE-ACETAMINOPHEN 5-325 MG PO TABS
1.0000 | ORAL_TABLET | ORAL | Status: DC | PRN
  Administered 2012-02-05 – 2012-02-07 (×9): 1 via ORAL
  Administered 2012-02-07 – 2012-02-09 (×7): 2 via ORAL
  Administered 2012-02-10 (×2): 1 via ORAL
  Administered 2012-02-10: 2 via ORAL
  Filled 2012-02-04 (×4): qty 2
  Filled 2012-02-04 (×3): qty 1
  Filled 2012-02-04 (×2): qty 2
  Filled 2012-02-04: qty 1
  Filled 2012-02-04 (×2): qty 2
  Filled 2012-02-04 (×4): qty 1
  Filled 2012-02-04: qty 2
  Filled 2012-02-04 (×2): qty 1

## 2012-02-04 MED ORDER — ONDANSETRON HCL 4 MG/2ML IJ SOLN
4.0000 mg | Freq: Four times a day (QID) | INTRAMUSCULAR | Status: DC | PRN
  Administered 2012-02-05 – 2012-02-09 (×3): 4 mg via INTRAVENOUS
  Filled 2012-02-04 (×2): qty 2

## 2012-02-04 MED ORDER — DEXTROSE-NACL 5-0.45 % IV SOLN
INTRAVENOUS | Status: DC
  Administered 2012-02-04 – 2012-02-06 (×2): via INTRAVENOUS

## 2012-02-04 MED ORDER — ONDANSETRON HCL 4 MG/2ML IJ SOLN
4.0000 mg | Freq: Four times a day (QID) | INTRAMUSCULAR | Status: DC | PRN
  Filled 2012-02-04: qty 2

## 2012-02-04 MED ORDER — LACTATED RINGERS IV SOLN
INTRAVENOUS | Status: DC
  Administered 2012-02-04: 09:00:00 via INTRAVENOUS

## 2012-02-04 MED ORDER — DIPHENHYDRAMINE HCL 50 MG/ML IJ SOLN: 12.5000 mg | Freq: Four times a day (QID) | INTRAMUSCULAR | Status: DC | PRN

## 2012-02-04 MED ORDER — SODIUM CHLORIDE 0.9 % IJ SOLN: 9.0000 mL | INTRAMUSCULAR | Status: DC | PRN

## 2012-02-04 MED ORDER — VECURONIUM BROMIDE 10 MG IV SOLR
INTRAVENOUS | Status: DC | PRN
  Administered 2012-02-04: 3 mg via INTRAVENOUS
  Administered 2012-02-04: 1 mg via INTRAVENOUS
  Administered 2012-02-04: 4 mg via INTRAVENOUS
  Administered 2012-02-04: 1 mg via INTRAVENOUS
  Administered 2012-02-04: 3 mg via INTRAVENOUS

## 2012-02-04 MED ORDER — MOXIFLOXACIN HCL IN NACL 400 MG/250ML IV SOLN
400.0000 mg | INTRAVENOUS | Status: DC
  Administered 2012-02-05 – 2012-02-09 (×5): 400 mg via INTRAVENOUS
  Filled 2012-02-04 (×6): qty 250

## 2012-02-04 MED ORDER — 0.9 % SODIUM CHLORIDE (POUR BTL) OPTIME
TOPICAL | Status: DC | PRN
  Administered 2012-02-04: 2000 mL

## 2012-02-04 MED ORDER — MORPHINE SULFATE 10 MG/ML IJ SOLN
INTRAMUSCULAR | Status: DC | PRN
  Administered 2012-02-04 (×2): 2 mg via INTRAVENOUS

## 2012-02-04 MED ORDER — BUPIVACAINE HCL (PF) 0.5 % IJ SOLN
INTRAMUSCULAR | Status: DC | PRN
  Administered 2012-02-04: 30 mL

## 2012-02-04 MED ORDER — BISACODYL 5 MG PO TBEC
10.0000 mg | DELAYED_RELEASE_TABLET | Freq: Every day | ORAL | Status: DC
  Administered 2012-02-04 – 2012-02-08 (×5): 10 mg via ORAL
  Filled 2012-02-04 (×5): qty 2

## 2012-02-04 MED ORDER — ONDANSETRON HCL 4 MG/2ML IJ SOLN
INTRAMUSCULAR | Status: DC | PRN
  Administered 2012-02-04: 4 mg via INTRAVENOUS

## 2012-02-04 MED ORDER — MORPHINE SULFATE (PF) 1 MG/ML IV SOLN
INTRAVENOUS | Status: DC
  Administered 2012-02-04: 4.5 mg via INTRAVENOUS
  Administered 2012-02-04 – 2012-02-05 (×2): via INTRAVENOUS
  Administered 2012-02-05: 7.5 mg via INTRAVENOUS
  Administered 2012-02-05 (×2): 15 mg via INTRAVENOUS
  Administered 2012-02-05: 3 mg via INTRAVENOUS
  Administered 2012-02-05: via INTRAVENOUS
  Administered 2012-02-05: 24 mg via INTRAVENOUS
  Administered 2012-02-05: 05:00:00 via INTRAVENOUS
  Administered 2012-02-06: 1.5 mg via INTRAVENOUS
  Administered 2012-02-06: 9 mg via INTRAVENOUS
  Administered 2012-02-06: 22:00:00 via INTRAVENOUS
  Administered 2012-02-06: 9 mg via INTRAVENOUS
  Administered 2012-02-06: 7.5 mg via INTRAVENOUS
  Administered 2012-02-06: 03:00:00 via INTRAVENOUS
  Administered 2012-02-06: 4.5 mg via INTRAVENOUS
  Administered 2012-02-06: 5 mg via INTRAVENOUS
  Administered 2012-02-06 – 2012-02-07 (×2): 3 mg via INTRAVENOUS
  Filled 2012-02-04 (×6): qty 25

## 2012-02-04 MED ORDER — POTASSIUM CHLORIDE 10 MEQ/50ML IV SOLN
10.0000 meq | Freq: Every day | INTRAVENOUS | Status: DC | PRN
  Filled 2012-02-04: qty 50

## 2012-02-04 MED ORDER — LUNG SURGERY BOOK
Freq: Once | Status: AC
  Administered 2012-02-04: 22:00:00
  Filled 2012-02-04 (×2): qty 1

## 2012-02-04 MED ORDER — LEVALBUTEROL HCL 0.63 MG/3ML IN NEBU
0.6300 mg | INHALATION_SOLUTION | Freq: Four times a day (QID) | RESPIRATORY_TRACT | Status: DC
  Administered 2012-02-04 – 2012-02-09 (×16): 0.63 mg via RESPIRATORY_TRACT
  Filled 2012-02-04 (×24): qty 3

## 2012-02-04 MED ORDER — LACTATED RINGERS IV SOLN
INTRAVENOUS | Status: DC | PRN
  Administered 2012-02-04: 09:00:00 via INTRAVENOUS

## 2012-02-04 MED ORDER — KETOROLAC TROMETHAMINE 30 MG/ML IJ SOLN
INTRAMUSCULAR | Status: AC
  Filled 2012-02-04: qty 1

## 2012-02-04 MED ORDER — DIPHENHYDRAMINE HCL 12.5 MG/5ML PO ELIX
12.5000 mg | ORAL_SOLUTION | Freq: Four times a day (QID) | ORAL | Status: DC | PRN
  Filled 2012-02-04: qty 5

## 2012-02-04 SURGICAL SUPPLY — 85 items
BALL CTTN LRG ABS STRL LF (GAUZE/BANDAGES/DRESSINGS)
BLADE SURG 11 STRL SS (BLADE) IMPLANT
BLADE SURG 15 STRL LF DISP TIS (BLADE) IMPLANT
BLADE SURG 15 STRL SS (BLADE) ×3
BRUSH CYTOL CELLEBRITY 1.5X140 (MISCELLANEOUS) IMPLANT
CANISTER SUCTION 2500CC (MISCELLANEOUS) ×9 IMPLANT
CATH KIT ON Q 5IN SLV (PAIN MANAGEMENT) ×1 IMPLANT
CATH THORACIC 28FR (CATHETERS) IMPLANT
CATH THORACIC 36FR (CATHETERS) IMPLANT
CATH THORACIC 36FR RT ANG (CATHETERS) IMPLANT
CLIP TI MEDIUM 24 (CLIP) ×3 IMPLANT
CLOTH BEACON ORANGE TIMEOUT ST (SAFETY) ×6 IMPLANT
CONT SPEC 4OZ CLIKSEAL STRL BL (MISCELLANEOUS) ×12 IMPLANT
COTTONBALL LRG STERILE PKG (GAUZE/BANDAGES/DRESSINGS) IMPLANT
COVER SURGICAL LIGHT HANDLE (MISCELLANEOUS) ×12 IMPLANT
COVER TABLE BACK 60X90 (DRAPES) ×3 IMPLANT
DRAPE CAMERA CLOSED 9X96 (DRAPES) IMPLANT
DRAPE LAPAROSCOPIC ABDOMINAL (DRAPES) ×3 IMPLANT
ELECT REM PT RETURN 9FT ADLT (ELECTROSURGICAL) ×3
ELECTRODE REM PT RTRN 9FT ADLT (ELECTROSURGICAL) ×2 IMPLANT
FORCEPS BIOP RJ4 1.8 (CUTTING FORCEPS) IMPLANT
GLOVE BIO SURGEON STRL SZ 6 (GLOVE) ×2 IMPLANT
GLOVE BIO SURGEON STRL SZ7 (GLOVE) ×1 IMPLANT
GLOVE BIO SURGEON STRL SZ7.5 (GLOVE) ×2 IMPLANT
GLOVE EUDERMIC 7 POWDERFREE (GLOVE) ×9 IMPLANT
GOWN PREVENTION PLUS XLARGE (GOWN DISPOSABLE) ×2 IMPLANT
GOWN STRL NON-REIN LRG LVL3 (GOWN DISPOSABLE) ×4 IMPLANT
HANDLE STAPLE ENDO GIA SHORT (STAPLE) ×1
KIT BASIN OR (CUSTOM PROCEDURE TRAY) ×3 IMPLANT
KIT ROOM TURNOVER OR (KITS) ×6 IMPLANT
MARKER SKIN DUAL TIP RULER LAB (MISCELLANEOUS) ×3 IMPLANT
NDL BIOPSY TRANSBRONCH 21G (NEEDLE) IMPLANT
NEEDLE 22X1 1/2 (OR ONLY) (NEEDLE) IMPLANT
NEEDLE BIOPSY TRANSBRONCH 21G (NEEDLE) IMPLANT
NS IRRIG 1000ML POUR BTL (IV SOLUTION) ×9 IMPLANT
OIL SILICONE PENTAX (PARTS (SERVICE/REPAIRS)) ×3 IMPLANT
PACK CHEST (CUSTOM PROCEDURE TRAY) ×3 IMPLANT
PAD ARMBOARD 7.5X6 YLW CONV (MISCELLANEOUS) ×12 IMPLANT
PAIN PUMP ON-Q 400MLX5ML 5IN (MISCELLANEOUS) ×1 IMPLANT
RELOAD EGIA 45 TAN VASC (STAPLE) ×4 IMPLANT
RELOAD EGIA 60 MED/THCK PURPLE (STAPLE) ×12 IMPLANT
RELOAD STAPLE 60 MED/THCK ART (STAPLE) IMPLANT
SEALANT PROGEL (MISCELLANEOUS) ×1 IMPLANT
SEALANT SURG COSEAL 4ML (VASCULAR PRODUCTS) IMPLANT
SEALANT SURG COSEAL 8ML (VASCULAR PRODUCTS) IMPLANT
SOLUTION ANTI FOG 6CC (MISCELLANEOUS) ×3 IMPLANT
SPECIMEN JAR MEDIUM (MISCELLANEOUS) ×3 IMPLANT
SPONGE GAUZE 4X4 12PLY (GAUZE/BANDAGES/DRESSINGS) ×6 IMPLANT
STAPLER ENDO GIA 12 SHRT THIN (STAPLE) IMPLANT
STAPLER ENDO GIA 12MM SHORT (STAPLE) ×2 IMPLANT
STAPLER TA30 4.8 NON-ABS (STAPLE) ×1 IMPLANT
SUT PDS AB 4-0 RB1 27 (SUTURE) ×5 IMPLANT
SUT PROLENE 3 0 SH DA (SUTURE) IMPLANT
SUT PROLENE 4 0 RB 1 (SUTURE)
SUT PROLENE 4-0 RB1 .5 CRCL 36 (SUTURE) IMPLANT
SUT SILK  1 MH (SUTURE) ×2
SUT SILK 1 MH (SUTURE) ×4 IMPLANT
SUT SILK 2 0 SH (SUTURE) ×3 IMPLANT
SUT SILK 2 0SH CR/8 30 (SUTURE) ×1 IMPLANT
SUT SILK 3 0 SH CR/8 (SUTURE) IMPLANT
SUT VIC AB 1 CTX 36 (SUTURE) ×6
SUT VIC AB 1 CTX36XBRD ANBCTR (SUTURE) ×4 IMPLANT
SUT VIC AB 2 TP1 27 (SUTURE) ×3 IMPLANT
SUT VIC AB 2-0 CT1 27 (SUTURE)
SUT VIC AB 2-0 CT1 TAPERPNT 27 (SUTURE) IMPLANT
SUT VIC AB 2-0 CTX 36 (SUTURE) ×6 IMPLANT
SUT VIC AB 3-0 SH 27 (SUTURE)
SUT VIC AB 3-0 SH 27X BRD (SUTURE) IMPLANT
SUT VIC AB 3-0 X1 27 (SUTURE) ×6 IMPLANT
SYR 20ML ECCENTRIC (SYRINGE) ×3 IMPLANT
SYR 5ML LUER SLIP (SYRINGE) ×3 IMPLANT
SYR CONTROL 10ML LL (SYRINGE) IMPLANT
SYSTEM SAHARA CHEST DRAIN ATS (WOUND CARE) ×3 IMPLANT
TAPE UMBILICAL 1/8 X36 TWILL (MISCELLANEOUS) IMPLANT
TIP APPLICATOR SPRAY EXTEND 16 (VASCULAR PRODUCTS) IMPLANT
TOWEL OR 17X24 6PK STRL BLUE (TOWEL DISPOSABLE) ×8 IMPLANT
TOWEL OR 17X26 10 PK STRL BLUE (TOWEL DISPOSABLE) ×3 IMPLANT
TRAP SPECIMEN MUCOUS 40CC (MISCELLANEOUS) ×3 IMPLANT
TRAY FOLEY CATH 14FRSI W/METER (CATHETERS) ×3 IMPLANT
TUBE CONNECTING 12X1/4 (SUCTIONS) ×3 IMPLANT
TUNNELER SHEATH ON-Q 11GX8 (MISCELLANEOUS) ×1 IMPLANT
VALVE BIOPSY  SINGLE USE (MISCELLANEOUS)
VALVE BIOPSY SINGLE USE (MISCELLANEOUS) IMPLANT
VALVE SUCTION BRONCHIO DISP (MISCELLANEOUS) IMPLANT
WATER STERILE IRR 1000ML POUR (IV SOLUTION) ×9 IMPLANT

## 2012-02-04 NOTE — Anesthesia Preprocedure Evaluation (Signed)
Anesthesia Evaluation  Patient identified by MRN, date of birth, ID band Patient awake    Reviewed: Allergy & Precautions, H&P , NPO status , Patient's Chart, lab work & pertinent test results  Airway Mallampati: II  Neck ROM: full    Dental   Pulmonary sleep apnea ,          Cardiovascular hypertension,     Neuro/Psych    GI/Hepatic   Endo/Other  obese  Renal/GU      Musculoskeletal   Abdominal   Peds  Hematology   Anesthesia Other Findings   Reproductive/Obstetrics                           Anesthesia Physical Anesthesia Plan  ASA: II  Anesthesia Plan: General   Post-op Pain Management:    Induction: Intravenous  Airway Management Planned: Oral ETT and Double Lumen EBT  Additional Equipment: Arterial line and CVP  Intra-op Plan:   Post-operative Plan: Extubation in OR  Informed Consent: I have reviewed the patients History and Physical, chart, labs and discussed the procedure including the risks, benefits and alternatives for the proposed anesthesia with the patient or authorized representative who has indicated his/her understanding and acceptance.     Plan Discussed with: CRNA and Surgeon  Anesthesia Plan Comments:         Anesthesia Quick Evaluation

## 2012-02-04 NOTE — Telephone Encounter (Signed)
I spoke with spouse and she states pt is having his lung surgery today by Dr. Collier Bullock over at [facility] from 10:25-2:45. I advised spouse hope everything goes well and will forward to MW as an [place].

## 2012-02-04 NOTE — OR Nursing (Signed)
Completed bronchoscopy at 1031.

## 2012-02-04 NOTE — Brief Op Note (Addendum)
02/04/2012  4:06 PM  PATIENT:  Harry Padilla  56 y.o. male  PRE-OPERATIVE DIAGNOSIS:  lung mass, hemoptysis  POST-OPERATIVE DIAGNOSIS:  lung mass, hemoptysis  PROCEDURE:  Video bronchoscopy, right thoracotomy and right middle lobectomy SURGEON:  Surgeon(s) and Role:    * Alleen Borne, MD - Primary  PHYSICIAN ASSISTANT: Gershon Crane, PA-C    ANESTHESIA:   general  EBL:  Total I/O In: 960 [I.V.:960] Out: 745 [Urine:545; Blood:200]  BLOOD ADMINISTERED:none  DRAINS:  33F chest tube anteriorly and 32 F Blake posteriorly  LOCAL MEDICATIONS USED:  NONE  SPECIMEN:  Source of Specimen:  right middle lobe  DISPOSITION OF SPECIMEN:  PATHOLOGY  COUNTS:  YES  PATIENT DISPOSITION:  PACU - hemodynamically stable.   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  Bronchial resection margin negative.

## 2012-02-04 NOTE — Progress Notes (Signed)
Patient ID: Harry Padilla, male   DOB: 09/13/1956, 56 y.o.   MRN: [id number] Filed Vitals:   02/04/12 1815 02/04/12 1830 02/04/12 1845 02/04/12 1900  BP: 121/60 121/58 119/60 118/62  Pulse: 79 84 89 88  Temp: 97.6 F (36.4 C)     TempSrc: Oral     Resp: 15 19 19 17   SpO2: 93% 96% 94% 94%    Fairly comfortable. Chest tube output low.  No air leak Urine output ok.

## 2012-02-04 NOTE — Transfer of Care (Signed)
Immediate Anesthesia Transfer of Care Note  Patient: Harry Padilla  Procedure(s) Performed: Procedure(s) (LRB): THORACOTOMY/LOBECTOMY (Right) VIDEO BRONCHOSCOPY (N/A)  Patient Location: PACU  Anesthesia Type: General  Level of Consciousness: patient cooperative and responds to stimulation  Airway & Oxygen Therapy: Patient Spontanous Breathing and Patient connected to face mask oxygen  Post-op Assessment: Report given to PACU RN, Post -op Vital signs reviewed and stable and Patient moving all extremities X 4  Post vital signs: Reviewed and stable  Complications: No apparent anesthesia complications

## 2012-02-04 NOTE — Anesthesia Procedure Notes (Addendum)
Procedure Name: Intubation Date/Time: 02/04/2012 10:24 AM Performed by: Everlene Balls TODD Pre-anesthesia Checklist: Patient identified, Emergency Drugs available, Suction available, Patient being monitored and Timeout performed Patient Re-evaluated:Patient Re-evaluated prior to inductionOxygen Delivery Method: Circle system utilized Preoxygenation: Pre-oxygenation with 100% oxygen Intubation Type: IV induction Ventilation: Two handed mask ventilation required and Oral airway inserted - appropriate to patient size Laryngoscope Size: Mac and 3 Grade View: Grade III Tube type: Oral Tube size: 8.5 mm Number of attempts: 2 Airway Equipment and Method: Stylet and Video-laryngoscopy Placement Confirmation: ETT inserted through vocal cords under direct vision,  positive ETCO2 and breath sounds checked- equal and bilateral Secured at: 22 cm Tube secured with: Tape Dental Injury: Teeth and Oropharynx as per pre-operative assessment  Difficulty Due To: Difficulty was anticipated, Difficult Airway- due to reduced neck mobility and Difficult Airway- due to limited oral opening Future Recommendations: Recommend- induction with short-acting agent, and alternative techniques readily available Comments: DL per SRNA   Procedure Name: Intubation Date/Time: 02/04/2012 10:36 AM Performed by: Everlene Balls TODD Pre-anesthesia Checklist: Patient identified, Emergency Drugs available, Suction available and Patient being monitored Patient Re-evaluated:Patient Re-evaluated prior to inductionOxygen Delivery Method: Circle system utilized Preoxygenation: Pre-oxygenation with 100% oxygen Intubation Type: IV induction Laryngoscope Size: Mac and 3 Endobronchial tube: Left and EBT position confirmed by fiberoptic bronchoscope and 39 Fr Number of attempts: 1 Airway Equipment and Method: Video-laryngoscopy (Cook airway exchange cath) Placement Confirmation: ETT inserted through vocal cords under direct vision,   positive ETCO2,  CO2 detector and breath sounds checked- equal and bilateral Tube secured with: Tape Dental Injury: Teeth and Oropharynx as per pre-operative assessment  Difficulty Due To: Difficulty was anticipated

## 2012-02-04 NOTE — Anesthesia Postprocedure Evaluation (Signed)
Anesthesia Post Note  Patient: Harry Padilla  Procedure(s) Performed: Procedure(s) (LRB): THORACOTOMY/LOBECTOMY (Right) VIDEO BRONCHOSCOPY (N/A)  Anesthesia type: general  Patient location: PACU  Post pain: Pain level controlled  Post assessment: Patient's Cardiovascular Status Stable  Last Vitals:  Filed Vitals:   02/04/12 1700  BP: 135/69  Pulse: 73  Temp:   Resp: 18    Post vital signs: Reviewed and stable  Level of consciousness: sedated  Complications: No apparent anesthesia complications

## 2012-02-04 NOTE — OR Nursing (Signed)
1st procedure video bronch ended @ 10:32am - 2nd procedure started @ 11:27am.--2nd timeout performed @ 11:25am

## 2012-02-04 NOTE — Interval H&P Note (Signed)
History and Physical Interval Note:  02/04/2012 9:29 AM  Harry Padilla  has presented today for surgery, with the diagnosis of lung mass, hemoptysis  The various methods of treatment have been discussed with the patient and family. After consideration of risks, benefits and other options for treatment, the patient has consented to  Procedure(s) (LRB): THORACOTOMY/LOBECTOMY (Right) VIDEO BRONCHOSCOPY (N/A) as a surgical intervention .  The patients' history has been reviewed, patient examined, no change in status, stable for surgery.  I have reviewed the patients' chart and labs.  Questions were answered to the patient's satisfaction.     Alleen Borne

## 2012-02-05 ENCOUNTER — Encounter (HOSPITAL_COMMUNITY): Payer: Self-pay

## 2012-02-05 ENCOUNTER — Inpatient Hospital Stay (HOSPITAL_COMMUNITY): Payer: BC Managed Care – PPO

## 2012-02-05 LAB — CBC
HCT: 37.5 % — ABNORMAL LOW (ref 39.0–52.0)
MCV: 88.7 fL (ref 78.0–100.0)
RBC: 4.23 MIL/uL (ref 4.22–5.81)
WBC: 15.3 10*3/uL — ABNORMAL HIGH (ref 4.0–10.5)

## 2012-02-05 LAB — BASIC METABOLIC PANEL
CO2: 24 mEq/L (ref 19–32)
Calcium: 8.3 mg/dL — ABNORMAL LOW (ref 8.4–10.5)
Glucose, Bld: 128 mg/dL — ABNORMAL HIGH (ref 70–99)
Sodium: 136 mEq/L (ref 135–145)

## 2012-02-05 LAB — BLOOD GAS, ARTERIAL
Acid-base deficit: 2 mmol/L (ref 0.0–2.0)
Patient temperature: 98.6
TCO2: 24.3 mmol/L (ref 0–100)
pCO2 arterial: 44.3 mmHg (ref 35.0–45.0)

## 2012-02-05 LAB — GLUCOSE, CAPILLARY
Glucose-Capillary: 149 mg/dL — ABNORMAL HIGH (ref 70–99)
Glucose-Capillary: 155 mg/dL — ABNORMAL HIGH (ref 70–99)

## 2012-02-05 NOTE — Care Management Note (Signed)
    Page 1 of 1   02/05/2012     8:47:19 AM   CARE MANAGEMENT NOTE 02/05/2012  Patient:  Harry Padilla, Harry Padilla   Account Number:  [id number]  Date Initiated:  02/05/2012  Documentation initiated by:  Junius Creamer  Subjective/Objective Assessment:   adm w cabg x1     Action/Plan:   lives w wife, pcp dr Julieanne Manson   Anticipated DC Date:     Anticipated DC Plan:        DC Planning Services  CM consult      Choice offered to / List presented to:             Status of service:   Medicare Important Message given?   (If response is "NO", the following Medicare IM given date fields will be blank) Date Medicare IM given:   Date Additional Medicare IM given:    Discharge Disposition:    Per UR Regulation:  Reviewed for med. necessity/level of care/duration of stay  If discussed at Long Length of Stay Meetings, dates discussed:    Comments:  5/24 debbie Latria Mccarron rn,bsn [phone]

## 2012-02-05 NOTE — Op Note (Signed)
NAME:  Harry Padilla, Harry Padilla NO.:  [id number]  MEDICAL RECORD NO.:  [id number]  LOCATION:  2308                         FACILITY:  [facility]  PHYSICIAN:  Evelene Croon, M.D.     DATE OF BIRTH:  10/12/1955  DATE OF PROCEDURE:  02/04/2012 DATE OF DISCHARGE:                              OPERATIVE REPORT   PREOPERATIVE DIAGNOSIS:  Right middle lobe lung mass with hemoptysis.  OPERATIVE PROCEDURE:  Flexible fiberoptic bronchoscopy, right muscle- sparing thoracotomy with right middle lobectomy.  ATTENDING SURGEON:  Evelene Croon, MD  ASSISTANT:  Rowe Clack, PA-C  ANESTHESIA:  General endotracheal.  CLINICAL HISTORY:  This patient is a 56 year old gentleman who is a healthy nonsmoker and presented with hemoptysis in January 2013.  Chest x-ray showed a right lung density and he was treated with antibiotics for possible pneumonia.  His chest x-ray findings did not improve, although his hemoptysis stopped for a while.  He was continued to have some cough and shortness of breath, and over the past of couple weeks, the hemoptysis resumed.  He underwent bronchoscopy by Dr. Sherene Sires, which showed complete occlusion of the right middle lobe bronchus by a very vascular-appearing mass that was actively oozing.  Biopsy was not performed.  The washings were performed and showed no malignancy.  I saw him in the office yesterday and he had had more hemoptysis yesterday morning.  He described this is a significant volume of blood on almost daily basis and therefore, I felt it was important to proceed ahead with surgical treatment.  I felt this was most likely a carcinoid tumor.  He did have a PET scan, which showed no hypermetabolic uptake within the mass.  There were no hypermetabolic lymph nodes.  I discussed the operative procedure with the patient and his wife.  We discussed performing flexible bronchoscopy as well as right thoracotomy for right middle lobectomy.  Also discussed the  possibility that we may need to perform by lobectomy to get a negative bronchial margin.  I discussed the benefits and risks including, but not limited to bleeding, blood transfusion, infection, prolonged air leak, respiratory failure, the possibility that this may not be a malignant tumor, and the possibility of tumor recurrence in the future.  He understood and agreed to proceed.  OPERATIVE PROCEDURE:  The patient was seen in the preoperative holding area.  Proper patient, proper operation, proper operative side were confirmed after reviewing his CAT scan and chart.  Then, the right side of the chest was signed by me.  Preoperative intravenous vancomycin and Avelox were given.  The patient was taken back to the operating room, placed on table in supine position.  After induction of general endotracheal anesthesia using a single-lumen tube, flexible fiberoptic bronchoscopy was performed.  The distal trachea appeared normal.  The carina was sharp.  The left bronchial tree had normal segmental anatomy without endobronchial lesions or extrinsic compression.  The right bronchial tree had normal bronchial anatomy, but the right middle lobe bronchus was occluded just inside its origin with a flashy mass that was not actively bleeding.  There were no other endobronchial lesions and no sign of extrinsic compression.  The bronchoscope was then withdrawn from  the patient.  A Foley catheter was placed in bladder using sterile technique.  Lower extremity sequential compression devices were used. The patient was then turned in the left lateral decubitus position with the right side up.  Right-sided chest was prepped with Betadine soap and solution, draped in usual sterile manner.  Time-out was taken.  Proper patient, proper operative side were confirmed with nursing and anesthesia staff.  Then, the right chest was entered through a lateral muscle-sparing thoracotomy incision.  The pleural space was  entered through the fifth intercostal space.  Chest retractor was placed. Exposure was somewhat difficult due to the patient's thick body size and the diaphragm pushing up from the abdomen.  I was able to get adequate exposure.  Then, the lung was examined.  There was a nearly absent fissure between the upper and middle lobes.  There was only one small area posteriorly, which was separated into a fissure.  There was complete division between the upper and middle and lower lobes.  Then, the lung was retracted laterally and the mediastinal pleura was opened over the superior pulmonary vein.  The phrenic nerve was identified and carefully avoided.  The superior pulmonary vein had three trunks.  There was a large superior trunk that went to the right upper lobe.  The two smaller trunks both went to the area of the middle lobe.  These were encircled with tapes.  Then, the major fissure was opened to expose the interlobar portion of the pulmonary artery.  Dissection was continued up along this artery and the right middle lobe branches were identified. There was one small branch and one large branch to the middle lobe.  The smaller branch was fairly short and this was ligated with a 2-0 silk suture ligature and clipped and divided.  The larger trocar was stapled with a vascular stapler.  Then, the two pulmonary vein trunks to the middle lobe were divided using vascular staplers.  It was difficult to expose the area where the fissure should be between the upper and middle lobe, and therefore, I felt it would be best to divide the right middle lobe bronchus first to aide this exposure.  The right middle lobe bronchus was identified anteriorly.  There were few lymph nodes around it that appears somewhat enlarged and these were taken with the specimen.  The right middle lobe bronchus was then encircled with a tape.  It was then divided using a TA-30 linear stapler with 4.8-mm staples.  The stapler  was placed around the bronchus and then closed as close as possible to the bronchus intermedius.  After closing the stapler, the right lung was inflated and the upper and lower lobes inflated without difficulty.  The stapler was fired and then the right middle lobe bronchus divided distal to the stapler.  Examination of the bronchus showed that the endobronchial complement of this tumor was immediately visible.  It did not appear to be violated.  There was one area were the tumor appeared be firmly attached to the endobronchial wall.  I suspected that this may be a positive bronchial margin, and then the minor fissure was completed using linear staplers and the specimen was sent to pathology for frozen section.  The pathologist called the operating room and said that the tumor was a low-grade malignant neoplasm, but could not be more specific that on frozen section.  He said that the stapled margin was positive for malignant cells.  Since it appeared that the tumor was only  attached to the bronchial wall and the small part of the circumference, I felt it would be best to try to preserve the lower lobe if possible.  I decided to excise the stable bronchial stump below the staple line and checked the margin there where I thought probably be negative.  Then, the right middle lobe bronchial stump was amputated using a scalpel blade about 1 mm below the staple line.  It was sent to pathology for frozen section and the frozen section on the true margin was negative for malignant cells.  Then, the opened right middle lobe bronchial stump was closed using interrupted 4-0 PDS simple sutures.  After completing this closure, the chest was filled with saline and the bronchial stump tested at 30 cm of water pressure and there were no air leaks.  There was complete hemostasis.  Then, an On-Q catheter was brought through a separate stab incision and positioned into a subpleural location posteriorly  along the intercostal nerves.  It was flushed with 5 mL of 5% Marcaine and connected to a paint ball with 0.5% Marcaine.  The staple line along the right upper lobe was coated with ProGel to seal any air leaks.  There were no air leaks visible after that.  Then, a 10- French straight chest tube was brought separate stab incision and positioned anteriorly on the chest.  A 32-French Blake drain was brought into an another stab incision and positioned posteriorly and at the base.  Then, the ribs were reapproximated with #2 Vicryl pericostal sutures.  The muscles were returned to normal their anatomic position. The lung was reinflated under direct inspection before tying the pericostal sutures.  The subcutaneous tissue was closed with continuous 2-0 Vicryl and the skin with a 3-0 Vicryl subcuticular closure.  The sponge, needle, and instrument counts were correct according to the scrub nurse. Dry sterile dressing was applied over the incision and around the chest tubes, which were hooked to Pleur-Evac suction.  The patient remained hemodynamically stable and was returned to the supine position, extubated, and transferred to the postanesthesia care unit in satisfactory and stable condition.     Evelene Croon, M.D.     BB/MEDQ  D:  02/04/2012  T:  02/05/2012  Job:  [id number]

## 2012-02-05 NOTE — Progress Notes (Signed)
1 Day Post-Op Procedure(s) (LRB): THORACOTOMY/LOBECTOMY (Right) VIDEO BRONCHOSCOPY (N/A) Subjective: No complaints.   Objective: Vital signs in last 24 hours: Temp:  [97.1 F (36.2 C)-98.6 F (37 C)] 98.6 F (37 C) (05/24 0750) Pulse Rate:  [64-94] 64  (05/24 0817) Cardiac Rhythm:  [-] Normal sinus rhythm (05/24 0400) Resp:  [8-25] 18  (05/24 0817) BP: (71-139)/(46-83) 100/62 mmHg (05/24 0700) SpO2:  [89 %-100 %] 95 % (05/24 0817) Arterial Line BP: (112-162)/(54-101) 162/68 mmHg (05/23 1930) Weight:  [104.4 kg (230 lb 2.6 oz)] 104.4 kg (230 lb 2.6 oz) (05/24 0600)  Hemodynamic parameters for last 24 hours:    Intake/Output from previous day: 05/23 0701 - 05/24 0700 In: 2638.5 [I.V.:2138.5; IV Piggyback:500] Out: 1945 [Urine:1305; Blood:200; Chest Tube:440] Intake/Output this shift:    General appearance: alert and cooperative Neurologic: intact Heart: regular rate and rhythm, S1, S2 normal, no murmur, click, rub or gallop Lungs: clear to auscultation bilaterally no air leak from chest tube  Lab Results:  Basename 02/05/12 0350 02/03/12 1558  WBC 15.3* 10.9*  HGB 12.9* 14.7  HCT 37.5* 40.8  PLT 237 280   BMET:  Basename 02/05/12 0350 02/03/12 1558  NA 136 139  K 4.2 3.7  CL 101 104  CO2 24 22  GLUCOSE 128* 96  BUN 23 17  CREATININE 1.09 0.83  CALCIUM 8.3* 9.5    PT/INR:  Basename 02/03/12 1558  LABPROT 12.0  INR 0.87   ABG    Component Value Date/Time   PHART 7.335* 02/05/2012 0410   HCO3 23.0 02/05/2012 0410   TCO2 24.3 02/05/2012 0410   ACIDBASEDEF 2.0 02/05/2012 0410   O2SAT 91.5 02/05/2012 0410   CBG (last 3)   Basename 02/05/12 0615 02/04/12 2352 02/04/12 1943  GLUCAP 155* 149* 147*    Assessment/Plan: S/P Procedure(s) (LRB): THORACOTOMY/LOBECTOMY (Right) VIDEO BRONCHOSCOPY (N/A) Mobilize chest tubes to water seal Pain control   LOS: 1 day    Harry Padilla K 02/05/2012

## 2012-02-06 ENCOUNTER — Inpatient Hospital Stay (HOSPITAL_COMMUNITY): Payer: BC Managed Care – PPO

## 2012-02-06 LAB — COMPREHENSIVE METABOLIC PANEL
ALT: 28 U/L (ref 0–53)
AST: 33 U/L (ref 0–37)
Albumin: 2.9 g/dL — ABNORMAL LOW (ref 3.5–5.2)
Alkaline Phosphatase: 57 U/L (ref 39–117)
BUN: 21 mg/dL (ref 6–23)
Chloride: 103 mEq/L (ref 96–112)
Potassium: 4.2 mEq/L (ref 3.5–5.1)
Sodium: 137 mEq/L (ref 135–145)
Total Bilirubin: 0.4 mg/dL (ref 0.3–1.2)

## 2012-02-06 LAB — CBC
HCT: 37.4 % — ABNORMAL LOW (ref 39.0–52.0)
Platelets: 227 10*3/uL (ref 150–400)
RDW: 14.1 % (ref 11.5–15.5)
WBC: 14.3 10*3/uL — ABNORMAL HIGH (ref 4.0–10.5)

## 2012-02-06 NOTE — Progress Notes (Signed)
2 Days Post-Op Procedure(s) (LRB): THORACOTOMY/LOBECTOMY (Right) VIDEO BRONCHOSCOPY (N/A) Subjective:                       [address]            [place] [address]          [phone]     RML lobectomy doing well  Objective: Vital signs in last 24 hours: Temp:  [98 F (36.7 C)-98.7 F (37.1 C)] 98.4 F (36.9 C) (05/25 0720) Pulse Rate:  [70-95] 92  (05/25 0900) Cardiac Rhythm:  [-] Normal sinus rhythm (05/25 0753) Resp:  [10-95] 23  (05/25 0900) BP: (95-147)/(59-92) 124/72 mmHg (05/25 0900) SpO2:  [91 %-98 %] 96 % (05/25 0900) Weight:  [231 lb 7.7 oz (105 kg)] 231 lb 7.7 oz (105 kg) (05/25 0600)  Hemodynamic parameters for last 24 hours:  NSR, SATS > .95  Intake/Output from previous day: 05/24 0701 - 05/25 0700 In: 1727 [P.O.:480; I.V.:997; IV Piggyback:250] Out: 2150 [Urine:1650; Chest Tube:500] Intake/Output this shift: Total I/O In: 40 [I.V.:40] Out: -   EXAM- no air leak,lungs clear  Lab Results:  Basename 02/06/12 0430 02/05/12 0350  WBC 14.3* 15.3*  HGB 12.6* 12.9*  HCT 37.4* 37.5*  PLT 227 237   BMET:  Basename 02/06/12 0430 02/05/12 0350  NA 137 136  K 4.2 4.2  CL 103 101  CO2 26 24  GLUCOSE 134* 128*  BUN 21 23  CREATININE 1.11 1.09  CALCIUM 8.4 8.3*    PT/INR:  Basename 02/03/12 1558  LABPROT 12.0  INR 0.87   ABG    Component Value Date/Time   PHART 7.335* 02/05/2012 0410   HCO3 23.0 02/05/2012 0410   TCO2 24.3 02/05/2012 0410   ACIDBASEDEF 2.0 02/05/2012 0410   O2SAT 91.5 02/05/2012 0410   CBG (last 3)   Basename 02/05/12 0615 02/04/12 2352 02/04/12 1943  GLUCAP 155* 149* 147*    Assessment/Plan: S/P Procedure(s) (LRB): THORACOTOMY/LOBECTOMY (Right) VIDEO BRONCHOSCOPY (N/A) d/c tubes/lines   LOS: 2 days    [name],Masayoshi Couzens 02/06/2012

## 2012-02-06 NOTE — Progress Notes (Signed)
Patient examined and record reviewed.Hemodynamics stable,labs satisfactory.Patient had stable day.Continue current care. [name],Rylynne Schicker 02/06/2012    

## 2012-02-07 ENCOUNTER — Inpatient Hospital Stay (HOSPITAL_COMMUNITY): Payer: BC Managed Care – PPO

## 2012-02-07 ENCOUNTER — Encounter (HOSPITAL_COMMUNITY): Payer: Self-pay | Admitting: Family Medicine

## 2012-02-07 LAB — BASIC METABOLIC PANEL
BUN: 16 mg/dL (ref 6–23)
CO2: 27 mEq/L (ref 19–32)
Calcium: 8.4 mg/dL (ref 8.4–10.5)
Chloride: 103 mEq/L (ref 96–112)
Creatinine, Ser: 0.98 mg/dL (ref 0.50–1.35)
GFR calc Af Amer: >90 mL/min (ref >90)
GFR calc non Af Amer: 89 mL/min — ABNORMAL LOW (ref >90)
Glucose, Bld: 115 mg/dL — ABNORMAL HIGH (ref 70–99)
Potassium: 3.6 mEq/L (ref 3.5–5.1)
Sodium: 136 mEq/L (ref 135–145)

## 2012-02-07 LAB — CBC
HCT: 35.3 % — ABNORMAL LOW (ref 39.0–52.0)
Hemoglobin: 12.1 g/dL — ABNORMAL LOW (ref 13.0–17.0)
MCH: 30.7 pg (ref 26.0–34.0)
MCHC: 34.3 g/dL (ref 30.0–36.0)
MCV: 89.6 fL (ref 78.0–100.0)
Platelets: 226 10*3/uL (ref 150–400)
RBC: 3.94 MIL/uL — ABNORMAL LOW (ref 4.22–5.81)
RDW: 13.8 % (ref 11.5–15.5)
WBC: 12 10*3/uL — ABNORMAL HIGH (ref 4.0–10.5)

## 2012-02-07 MED ORDER — NEOMYCIN-POLYMYXIN-HC 3.5-10000-1 OP SUSP
3.0000 [drp] | Freq: Three times a day (TID) | OPHTHALMIC | Status: DC
  Filled 2012-02-07: qty 7.5

## 2012-02-07 MED ORDER — BACITRACIN-POLYMYXIN B 500-10000 UNIT/GM OP OINT
TOPICAL_OINTMENT | Freq: Three times a day (TID) | OPHTHALMIC | Status: DC
  Administered 2012-02-07 – 2012-02-10 (×9): via OPHTHALMIC
  Filled 2012-02-07: qty 3.5

## 2012-02-07 NOTE — Progress Notes (Signed)
3 Days Post-Op Procedure(s) (LRB): THORACOTOMY/LOBECTOMY (Right) VIDEO BRONCHOSCOPY (N/A) Subjective:                      [address]            [place] [address]          [phone]      R VATS ML lobectomy CXR clear no air leak  Objective: Vital signs in last 24 hours: Temp:  [97.7 F (36.5 C)-98.6 F (37 C)] 98.5 F (36.9 C) (05/26 0729) Pulse Rate:  [74-95] 78  (05/26 0800) Cardiac Rhythm:  [-] Normal sinus rhythm (05/26 0800) Resp:  [13-23] 13  (05/26 0800) BP: (117-154)/(65-89) 145/80 mmHg (05/26 0800) SpO2:  [94 %-100 %] 94 % (05/26 0800) Weight:  [231 lb 11.3 oz (105.1 kg)] 231 lb 11.3 oz (105.1 kg) (05/26 0500)  Hemodynamic parameters for last 24 hours:    Intake/Output from previous day: 05/25 0701 - 05/26 0700 In: 1226 [P.O.:720; I.V.:506] Out: 3100 [Urine:2800; Chest Tube:300] Intake/Output this shift: Total I/O In: 20 [I.V.:20] Out: -   Lungs clear  Lab Results:  Basename 02/07/12 0445 02/06/12 0430  WBC 12.0* 14.3*  HGB 12.1* 12.6*  HCT 35.3* 37.4*  PLT 226 227   BMET:  Basename 02/07/12 0445 02/06/12 0430  NA 136 137  K 3.6 4.2  CL 103 103  CO2 27 26  GLUCOSE 115* 134*  BUN 16 21  CREATININE 0.98 1.11  CALCIUM 8.4 8.4    PT/INR: No results found for this basename: LABPROT,INR in the last 72 hours ABG    Component Value Date/Time   PHART 7.335* 02/05/2012 0410   HCO3 23.0 02/05/2012 0410   TCO2 24.3 02/05/2012 0410   ACIDBASEDEF 2.0 02/05/2012 0410   O2SAT 91.5 02/05/2012 0410   CBG (last 3)   Basename 02/05/12 0615 02/04/12 2352 02/04/12 1943  GLUCAP 155* 149* 147*    Assessment/Plan: S/P Procedure(s) (LRB): THORACOTOMY/LOBECTOMY (Right) VIDEO BRONCHOSCOPY (N/A) Plan for transfer to step-down: see transfer orders DC chest tube, central line  LOS: 3 days    Harry Padilla,Harry Padilla 02/07/2012

## 2012-02-07 NOTE — Progress Notes (Signed)
18cc of morphine wasted.  Asher Muir, RN & Silva Bandy, RN

## 2012-02-08 ENCOUNTER — Inpatient Hospital Stay (HOSPITAL_COMMUNITY): Payer: BC Managed Care – PPO

## 2012-02-08 LAB — BASIC METABOLIC PANEL
BUN: 14 mg/dL (ref 6–23)
CO2: 26 mEq/L (ref 19–32)
Calcium: 8.6 mg/dL (ref 8.4–10.5)
Chloride: 104 mEq/L (ref 96–112)
Creatinine, Ser: 0.89 mg/dL (ref 0.50–1.35)
GFR calc Af Amer: >90 mL/min (ref >90)
GFR calc non Af Amer: >90 mL/min (ref >90)
Glucose, Bld: 113 mg/dL — ABNORMAL HIGH (ref 70–99)
Potassium: 3.3 mEq/L — ABNORMAL LOW (ref 3.5–5.1)
Sodium: 140 mEq/L (ref 135–145)

## 2012-02-08 LAB — CBC
HCT: 34.3 % — ABNORMAL LOW (ref 39.0–52.0)
Hemoglobin: 12 g/dL — ABNORMAL LOW (ref 13.0–17.0)
MCH: 30.8 pg (ref 26.0–34.0)
MCHC: 35 g/dL (ref 30.0–36.0)
MCV: 87.9 fL (ref 78.0–100.0)
Platelets: 253 10*3/uL (ref 150–400)
RBC: 3.9 MIL/uL — ABNORMAL LOW (ref 4.22–5.81)
RDW: 13.7 % (ref 11.5–15.5)
WBC: 10.5 10*3/uL (ref 4.0–10.5)

## 2012-02-08 MED ORDER — MUPIROCIN 2 % EX OINT
TOPICAL_OINTMENT | Freq: Two times a day (BID) | CUTANEOUS | Status: DC
  Administered 2012-02-08 – 2012-02-10 (×4): via NASAL
  Filled 2012-02-08: qty 22

## 2012-02-08 MED ORDER — POTASSIUM CHLORIDE CRYS ER 20 MEQ PO TBCR
40.0000 meq | EXTENDED_RELEASE_TABLET | Freq: Once | ORAL | Status: AC
  Administered 2012-02-08: 40 meq via ORAL
  Filled 2012-02-08: qty 2

## 2012-02-08 NOTE — Progress Notes (Signed)
1100 - pt ambulated 469ft independently, steady gait.  Will continue to monitor. Harry Padilla

## 2012-02-08 NOTE — Progress Notes (Signed)
                    [address]            [place] [address]          [phone]     4 Days Post-Op Procedure(s) (LRB): THORACOTOMY/LOBECTOMY (Right) VIDEO BRONCHOSCOPY (N/A)  Subjective: Breathing stable, some soreness. Down to 2L O2.  Objective: Vital signs in last 24 hours: Patient Vitals for the past 24 hrs:  BP Temp Temp src Pulse Resp SpO2 Height Weight  02/08/12 0823 - - - - - 94 % - -  02/08/12 0430 121/73 mmHg 97.2 F (36.2 C) Oral 77  18  94 % - -  02/07/12 2053 153/76 mmHg 99.2 F (37.3 C) Oral 93  18  95 % - -  02/07/12 1504 - - - - - 95 % - -  02/07/12 1440 137/76 mmHg 98.3 F (36.8 C) - 88  18  95 % - -  02/07/12 1329 165/83 mmHg 98.2 F (36.8 C) Oral 81  14  96 % 5\' 8"  (1.727 m) 225 lb (102.059 kg)  02/07/12 1200 140/80 mmHg 98.4 F (36.9 C) Oral - 18  98 % - -   Current Weight  02/07/12 225 lb (102.059 kg)     Intake/Output from previous day: 05/26 0701 - 05/27 0700 In: 1000 [P.O.:480; I.V.:20; IV Piggyback:500] Out: 600 [Urine:550; Chest Tube:50]    PHYSICAL EXAM:  Heart: RRR Lungs: few coarse BS bilateral bases Wound: clean and dry   Lab Results: CBC: Basename 02/08/12 0550 02/07/12 0445  WBC 10.5 12.0*  HGB 12.0* 12.1*  HCT 34.3* 35.3*  PLT 253 226   BMET:  Basename 02/08/12 0550 02/07/12 0445  NA 140 136  K 3.3* 3.6  CL 104 103  CO2 26 27  GLUCOSE 113* 115*  BUN 14 16  CREATININE 0.89 0.98  CALCIUM 8.6 8.4    CXR: small R apical ptx, increased R basilar effusion/atx  Assessment/Plan: S/P Procedure(s) (LRB): THORACOTOMY/LOBECTOMY (Right) VIDEO BRONCHOSCOPY (N/A) Continue pulm toilet, wean O2. Ambulate. Hypokalemia- replace K+.    LOS: 4 days    Keara Pagliarulo H 02/08/2012

## 2012-02-08 NOTE — Progress Notes (Signed)
Pt set up for home cpap unit and places on himself, may or may not need assistnace with o2 bleed in

## 2012-02-08 NOTE — Progress Notes (Signed)
Pt ambulated 600 ft independently, steady gait.  Will continue to monitor.  Ninetta Lights RN

## 2012-02-08 NOTE — Progress Notes (Signed)
Pt ambulated 600 ft independently, steady gait, tolerated well.  Will continue to monitor. Ninetta Lights

## 2012-02-09 ENCOUNTER — Inpatient Hospital Stay (HOSPITAL_COMMUNITY): Payer: BC Managed Care – PPO

## 2012-02-09 ENCOUNTER — Encounter (HOSPITAL_COMMUNITY): Payer: Self-pay | Admitting: Surgery

## 2012-02-09 MED ORDER — MUPIROCIN 2 % EX OINT: TOPICAL_OINTMENT | Freq: Two times a day (BID) | CUTANEOUS | Status: AC

## 2012-02-09 MED ORDER — OXYCODONE-ACETAMINOPHEN 5-325 MG PO TABS: 1.0000 | ORAL_TABLET | ORAL | Status: DC | PRN

## 2012-02-09 MED ORDER — LEVALBUTEROL HCL 0.63 MG/3ML IN NEBU
0.6300 mg | INHALATION_SOLUTION | Freq: Four times a day (QID) | RESPIRATORY_TRACT | Status: DC | PRN
  Administered 2012-02-09: 0.63 mg via RESPIRATORY_TRACT
  Filled 2012-02-09: qty 3

## 2012-02-09 NOTE — Progress Notes (Addendum)
5 Days Post-Op Procedure(s) (LRB): THORACOTOMY/LOBECTOMY (Right) VIDEO BRONCHOSCOPY (N/A)  Subjective: Mr. Egger continues to complain of shortness of breath.  However, he states he feels its getting better.  He remains on oxygen  Objective: Vital signs in last 24 hours: Temp:  [97.5 F (36.4 C)-98.4 F (36.9 C)] 98.4 F (36.9 C) (05/28 0451) Pulse Rate:  [77-90] 78  (05/28 0451) Cardiac Rhythm:  [-] Normal sinus rhythm (05/27 1930) Resp:  [18-19] 19  (05/28 0451) BP: (130-151)/(72-80) 143/80 mmHg (05/28 0451) SpO2:  [91 %-93 %] 92 % (05/28 0451)  Intake/Output from previous day: 05/27 0701 - 05/28 0700 In: 1440 [P.O.:1440] Out: 301 [Urine:300; Stool:1]    General appearance: alert, cooperative and no distress Heart: regular rate and rhythm Lungs: clear to auscultation bilaterally Abdomen: soft, non-tender; bowel sounds normal; no masses,  no organomegaly Wound: clean and dry  Lab Results:  Basename 02/08/12 0550 02/07/12 0445  WBC 10.5 12.0*  HGB 12.0* 12.1*  HCT 34.3* 35.3*  PLT 253 226   BMET:  Basename 02/08/12 0550 02/07/12 0445  NA 140 136  K 3.3* 3.6  CL 104 103  CO2 26 27  GLUCOSE 113* 115*  BUN 14 16  CREATININE 0.89 0.98  CALCIUM 8.6 8.4    PT/INR: No results found for this basename: LABPROT,INR in the last 72 hours ABG    Component Value Date/Time   PHART 7.335* 02/05/2012 0410   HCO3 23.0 02/05/2012 0410   TCO2 24.3 02/05/2012 0410   ACIDBASEDEF 2.0 02/05/2012 0410   O2SAT 91.5 02/05/2012 0410   CBG (last 3)  No results found for this basename: GLUCAP:3 in the last 72 hours  Assessment/Plan: S/P Procedure(s) (LRB): THORACOTOMY/LOBECTOMY (Right) VIDEO BRONCHOSCOPY (N/A)  2. Resp- dyspnea, on oxygen, will continue IS, nebulizers, wean oxgyen as tolerated 3. CXR shows decrease in right apical pneumothorax 4. Dispo- patient doing better, will continue to attempt to wean off oxygen, possible d/c home in next 24-48 hours   LOS: 5 days     Lowella Dandy 02/09/2012    Chart reviewed, patient examined, agree with above. He is doing well overall.  CXR still shows some atelectasis in the RLL but improving.  He will probably be ready for discharge in a day or two.

## 2012-02-09 NOTE — Progress Notes (Signed)
Pt ambulated 500 ft independently and tolerated activity well. Will continue to monitor.  

## 2012-02-09 NOTE — Discharge Instructions (Signed)
Lung Resection Care After Refer to this sheet in the next few weeks. These instructions provide you with information on caring for yourself after your procedure. Your caregiver may also give you more specific instructions. Your treatment has been planned according to current medical practices, but problems sometimes occur. Call your caregiver if you have any problems or questions after your procedure. HOME CARE INSTRUCTIONS  You may resume a normal diet and activities as directed.   Do not smoke or use tobacco products.   Change your bandages (dressings) as directed.   Only take over-the-counter or prescription medicines for pain, discomfort, or fever as directed by your caregiver.   Keep all follow-up appointments as directed.   Try to breathe deeply and cough as directed. Holding a pillow firmly over your ribs may help with discomfort.   If you were given an incentive spirometer in the hospital, continue to use it as directed.   Walk as directed by your caregiver.   You may take a shower and gently wash the area of your surgical cut (incision) with water and soap as directed. Do not use anything else to clean your incision except as directed by your caregiver. Do not take baths or sit in a hot tub.  SEEK MEDICAL CARE IF:  You notice redness, swelling, or increasing pain in the incision.   You are bleeding from the incision.   You see pus coming from the incision.   You notice a bad smell coming from the incision or dressing.   Your incision breaks open.   You cough up blood or pus, or you develop a cough that produces bad smelling sputum.   You have pain or swelling in your legs.   You have increasing pain that is not controlled with medicine.   You have trouble managing any of the tubes that have been left in place after surgery.  SEEK IMMEDIATE MEDICAL CARE IF:   You have a fever or chills.   You have any reaction or side effects to medicines given.   You have chest  pain or an irregular or rapid heartbeat.   You have dizzy episodes or fainting.   You have shortness of breath or difficulty breathing.   You have persistent nausea or vomiting.   You have a rash.  MAKE SURE YOU:  Understand these instructions.   Will watch your condition.   Will get help right away if you are not doing well or get worse.  Document Released: 03/20/2005 Document Revised: 08/20/2011 Document Reviewed: 04/30/2011 [employer] Patient Information 2012 [employer]. 

## 2012-02-09 NOTE — Progress Notes (Signed)
UR Completed/ UPDATED Simmons, Huel Centola F [phone]  

## 2012-02-09 NOTE — Progress Notes (Signed)
Pt ambulated in hallway 500 ft independently and tolerated activity well. Will continue to monitor and encourage.

## 2012-02-10 ENCOUNTER — Inpatient Hospital Stay (HOSPITAL_COMMUNITY): Payer: BC Managed Care – PPO

## 2012-02-10 NOTE — Progress Notes (Signed)
Patient ambulated with wife independently on room air approximately 800 ft.  Patient walked at a steady, quick pace.  Patient tolerated ambulation well and returned to bed resting. Will continue to monitor.

## 2012-02-10 NOTE — Discharge Summary (Signed)
[address]            [place] [address]          [phone]         Discharge Summary  Name: Harry Padilla DOB: Sep 26, 1955 56 y.o. MRN: [id number]  Admission Date: 02/04/2012 Discharge Date: 02/10/2012   Admitting Diagnosis:  Right middle lobe lung lesion  Hemoptysis   Discharge Diagnosis:   Right middle lobe lung lesion  Hemoptysis .     Viral pneumonia  .     BPH (benign prostatic hyperplasia)  .     Hypercholesterolemia  .     Hypertension  .     Obesity  .     OSA (obstructive sleep apnea)      Procedures: Procedure(s): RIGHT THORACOTOMY/RIGHT MIDDLE LOBECTOMY FLEXIBLE FIBEROPTIC BRONCHOSCOPY on 02/04/2012   HPI:  The patient is a 56 y.o. male nonsmoker who reports developing hemoptysis in January 2013. He said that a chest x-ray showed a right lung density and he was treated with antibiotics for possible pneumonia. The chest x-ray findings did not improve although the hemoptysis stopped. He was continuing to have some cough and shortness of breath. Over the past couple weeks the hemoptysis has resumed. He said this typically occurs in the morning and he will cough up some blood clot followed by some bright red blood which then stops. He said this is more than just blood streaking. Bronchoscopy was performed by Dr. Sherene Sires and showed complete occlusion of the RML bronchus by a very vascular mass that was actively oozing. Biopsy was not performed but washings were and showed no malignancy. He was referred to Dr. Evelene Croon for surgical evaluation. His CT scan was reviewed and a PET scan was ordered, which revealed no hypermetabolic activity in the right middle lobe. Because of his significant hemoptysis and chronic bronchial obstruction , Dr. Laneta Simmers recommended surgical treatment with right middle lobectomy. All risks, benefits and alternatives of surgery were explained in detail, and the patient agreed to  proceed.    Hospital Course:  The patient was admitted to [facility] on 02/04/2012. The patient was taken to the operating room and underwent the above procedure.    The postoperative course has been uneventful. His chest tubes have been removed in the standard fashion, and his followup chest x-ray reveals no pneumothorax. He has had persistent right lower lobe atelectasis, but this is improving with aggressive pulmonary toilet measures. He currently has been weaned from supplemental oxygen and is maintaining O2 sats of greater than 90% on room air. He is tolerating a regular diet. He is ambulating in the halls without difficulty. His final pathology remains pending at the time this dictation. He has been evaluated on today's date and is ready for discharge home at this time.   Recent vital signs:  Filed Vitals:   02/10/12 0545  BP: 139/84  Pulse: 77  Temp: 98.8 F (37.1 C)  Resp: 18    Recent laboratory studies:  CBC: Basename 02/08/12 0550  WBC 10.5  HGB 12.0*  HCT 34.3*  PLT 253   BMET:  Basename 02/08/12 0550  NA 140  K 3.3*  CL 104  CO2 26  GLUCOSE 113*  BUN 14  CREATININE 0.89  CALCIUM 8.6    PT/INR: No results found for this basename: LABPROT,INR in the last 72 hours  Discharge Medications:  Medication List  As of 02/10/2012  9:44 AM   STOP taking these medications         hydrocortisone cream 1 %         TAKE these medications         amLODipine-olmesartan 5-20 MG per tablet   Commonly known as: AZOR   Take 1 tablet by mouth daily.      cholecalciferol 400 UNITS Tabs   Commonly known as: VITAMIN D   Take 400 Units by mouth daily.      Loratadine 10 MG Caps   Take 1 capsule by mouth at bedtime as needed. For allergies      meloxicam 15 MG tablet   Commonly known as: MOBIC   Take 15 mg by mouth daily.      MENS MULTIPLUS PO   Take 1 tablet by mouth daily.      mupirocin ointment 2 %   Commonly known as: BACTROBAN   Apply topically 2 (two)  times daily.      oxyCODONE-acetaminophen 5-325 MG per tablet   Commonly known as: PERCOCET   Take 1-2 tablets by mouth every 4 (four) hours as needed.      vitamin C 250 MG tablet   Commonly known as: ASCORBIC ACID   Take 250 mg by mouth daily.      vitamin E 400 UNIT capsule   Take 400 Units by mouth daily.            Discharge Instructions:  The patient is to refrain from driving, heavy lifting or strenuous activity.  May shower daily and clean incisions with soap and water.  May resume regular diet.  Discharge Orders    Future Appointments: Provider: Department: Dept Phone: Center:   02/16/2012 9:30 AM Jesus Genera Nurse [facility] [phone] [facility]   03/01/2012 2:00 PM Alleen Borne, MD [facility] [phone] [facility]      Follow-up Information    Follow up with Alleen Borne, MD. (02/28/12 at 2:00 - bring chest xray from [facility]. obtain chest xray at 1:00 pm . [facility] is in the same office complex as Dr Garen Grams office)    Contact information:   [address] [place] [address] [phone]       Follow up with [facility] NURSE. (see Jolene at 9:30 on 02/16/12 at Dr Sharee Pimple office for suture removal)           Ryot Burrous H 02/10/2012, 9:44 AM

## 2012-02-10 NOTE — Progress Notes (Signed)
                    [address]            [place] [address]          [phone]     6 Days Post-Op Procedure(s) (LRB): THORACOTOMY/LOBECTOMY (Right) VIDEO BRONCHOSCOPY (N/A)  Subjective: Feels much better.  Breathing stable, off O2.   Objective: Vital signs in last 24 hours: Patient Vitals for the past 24 hrs:  BP Temp Temp src Pulse Resp SpO2  02/10/12 0545 139/84 mmHg 98.8 F (37.1 C) Oral 77  18  95 %  02/10/12 0103 - - - 66  16  -  02/09/12 2138 156/77 mmHg 98.6 F (37 C) Oral 84  18  93 %  02/09/12 2136 - - - - - 94 %  02/09/12 1331 132/77 mmHg 98.9 F (37.2 C) Oral 83  18  90 %  02/09/12 0905 - - - - - 93 %   Current Weight  02/07/12 225 lb (102.059 kg)     Intake/Output from previous day:      PHYSICAL EXAM:  Heart: RRR Lungs: clear Wound: clean and dry    Lab Results: CBC: Basename 02/08/12 0550  WBC 10.5  HGB 12.0*  HCT 34.3*  PLT 253   BMET:  Basename 02/08/12 0550  NA 140  K 3.3*  CL 104  CO2 26  GLUCOSE 113*  BUN 14  CREATININE 0.89  CALCIUM 8.6    CXR: IMPRESSION:  1. Postoperative changes and volume loss in the right hemithorax,  stable. There may be a trace amount of pleural air at the right  apex.  2. Improving atelectasis at the left lung base   Assessment/Plan: S/P Procedure(s) (LRB): THORACOTOMY/LOBECTOMY (Right) VIDEO BRONCHOSCOPY (N/A) Stable.  Hopefully home later today since he is off O2.   LOS: 6 days    Hendrixx Severin H 02/10/2012

## 2012-02-10 NOTE — Progress Notes (Signed)
Patient has been weaned to room air.  Patient's O2 while resting was 95 on room air. Patient went to chest xray without oxygen and felt fine.  Checked patient's O2 saturation after he walked to nursing station and back to room and it was 90. Will continue to monitor.

## 2012-02-11 ENCOUNTER — Encounter: Payer: Self-pay | Admitting: Internal Medicine

## 2012-02-16 ENCOUNTER — Ambulatory Visit (INDEPENDENT_AMBULATORY_CARE_PROVIDER_SITE_OTHER): Payer: Self-pay | Admitting: *Deleted

## 2012-02-16 DIAGNOSIS — R042 Hemoptysis: Secondary | ICD-10-CM

## 2012-02-16 DIAGNOSIS — Z4802 Encounter for removal of sutures: Secondary | ICD-10-CM

## 2012-02-16 DIAGNOSIS — D381 Neoplasm of uncertain behavior of trachea, bronchus and lung: Secondary | ICD-10-CM

## 2012-02-16 DIAGNOSIS — R918 Other nonspecific abnormal finding of lung field: Secondary | ICD-10-CM

## 2012-02-16 DIAGNOSIS — Z09 Encounter for follow-up examination after completed treatment for conditions other than malignant neoplasm: Secondary | ICD-10-CM

## 2012-02-17 ENCOUNTER — Other Ambulatory Visit: Payer: Self-pay

## 2012-02-17 DIAGNOSIS — G8918 Other acute postprocedural pain: Secondary | ICD-10-CM

## 2012-02-17 NOTE — Telephone Encounter (Signed)
Called in new RX for Vicodin 5/500 mg #40/0 to pharm

## 2012-02-18 NOTE — Progress Notes (Signed)
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  All surgical sites are well healed.  He is experiencing minimal discomfort, mostly nerve sensations.  Lungs are clear to auscultations.  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  He will return as scheduled with a cxr to see Dr. Laneta Simmers.

## 2012-02-22 ENCOUNTER — Other Ambulatory Visit: Payer: Self-pay | Admitting: *Deleted

## 2012-02-22 DIAGNOSIS — G8918 Other acute postprocedural pain: Secondary | ICD-10-CM

## 2012-02-22 MED ORDER — HYDROCODONE-ACETAMINOPHEN 5-500 MG PO TABS: 1.0000 | ORAL_TABLET | Freq: Four times a day (QID) | ORAL | Status: AC | PRN

## 2012-02-25 ENCOUNTER — Other Ambulatory Visit: Payer: Self-pay | Admitting: Surgery

## 2012-02-25 DIAGNOSIS — D381 Neoplasm of uncertain behavior of trachea, bronchus and lung: Secondary | ICD-10-CM

## 2012-02-26 ENCOUNTER — Other Ambulatory Visit: Payer: Self-pay | Admitting: Surgery

## 2012-02-26 DIAGNOSIS — D381 Neoplasm of uncertain behavior of trachea, bronchus and lung: Secondary | ICD-10-CM

## 2012-02-29 ENCOUNTER — Ambulatory Visit (INDEPENDENT_AMBULATORY_CARE_PROVIDER_SITE_OTHER): Payer: Self-pay | Admitting: Physician Assistant

## 2012-02-29 ENCOUNTER — Ambulatory Visit
Admission: RE | Admit: 2012-02-29 | Discharge: 2012-02-29 | Disposition: A | Discharge status: Home / Self Care | Payer: BC Managed Care – PPO | Source: Ambulatory Visit | Attending: Surgery | Admitting: Surgery

## 2012-02-29 VITALS — BP 127/84 | HR 79 | Temp 98.1°F | Resp 16 | Ht 68.0 in | Wt 226.0 lb

## 2012-02-29 DIAGNOSIS — D381 Neoplasm of uncertain behavior of trachea, bronchus and lung: Secondary | ICD-10-CM

## 2012-02-29 DIAGNOSIS — D3A00 Benign carcinoid tumor of unspecified site: Secondary | ICD-10-CM

## 2012-02-29 NOTE — Progress Notes (Addendum)
                    [address]            [place] [address]          [phone]     HPI: Patient returns for routine postoperative follow-up having undergone a right middle lobectomy by Dr. Laneta Simmers on 02/04/2012 for a low-grade carcinoid tumor. The patient's postoperative course was generally uneventful, and he was discharged home on 02/10/2012.  Since hospital discharge, the patient has done well.  He is still sore, but his pain is improving, and he is taking mostly OTC pain meds during the day and Vicodin at night.  He denies shortness of breath or hemoptysis.  He has no complaints at this time.   Current Outpatient Prescriptions  Medication Sig Dispense Refill  . amLODipine-olmesartan (AZOR) 5-20 MG per tablet Take 1 tablet by mouth daily.      . cholecalciferol (VITAMIN D) 400 UNITS TABS Take 400 Units by mouth daily.      . Loratadine 10 MG CAPS Take 1 capsule by mouth at bedtime as needed. For allergies      . meloxicam (MOBIC) 15 MG tablet Take 15 mg by mouth daily.      . Multiple Vitamins-Minerals (MENS MULTIPLUS PO) Take 1 tablet by mouth daily.      . vitamin C (ASCORBIC ACID) 250 MG tablet Take 250 mg by mouth daily.      . vitamin E 400 UNIT capsule Take 400 Units by mouth daily.      Marland Kitchen HYDROcodone-acetaminophen (VICODIN) 5-500 MG per tablet Take 1 tablet by mouth every 6 (six) hours as needed for pain (may take one or two q 4-6 hrs prn pain...max of 8 tabs per day).  40 tablet  0    Physical Exam: BP 127/84 HR 79 Resp 16 Wounds: Clean and dry Heart: regular rate and rhythm Lungs: clear to auscultation    Diagnostic Tests: Chest xray: IMPRESSION:  1. Resolved small right pneumothorax.  2. Persistent volume loss and basilar opacity on the right  consistent with history of middle lobectomy.    Assessment/Plan: Harry Padilla is doing well post-discharge.  I gave him a refill on Vicodin, #30, which he is taking mostly at night.  We discussed  increasing his activity as tolerated.  He may drive once he is no longer taking narcotic pain medications.  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  We will see him back in 2 weeks with a chest x-ray.

## 2012-03-01 ENCOUNTER — Encounter: Payer: Self-pay | Admitting: Surgery

## 2012-03-08 ENCOUNTER — Other Ambulatory Visit: Payer: Self-pay | Admitting: Surgery

## 2012-03-08 DIAGNOSIS — D381 Neoplasm of uncertain behavior of trachea, bronchus and lung: Secondary | ICD-10-CM

## 2012-03-11 ENCOUNTER — Other Ambulatory Visit: Payer: Self-pay | Admitting: *Deleted

## 2012-03-11 DIAGNOSIS — G8918 Other acute postprocedural pain: Secondary | ICD-10-CM

## 2012-03-11 MED ORDER — HYDROCODONE-ACETAMINOPHEN 5-500 MG PO TABS: 1.0000 | ORAL_TABLET | ORAL | Status: AC | PRN

## 2012-03-15 ENCOUNTER — Encounter: Payer: Self-pay | Admitting: Surgery

## 2012-03-15 ENCOUNTER — Ambulatory Visit
Admission: RE | Admit: 2012-03-15 | Discharge: 2012-03-15 | Disposition: A | Discharge status: Home / Self Care | Payer: BC Managed Care – PPO | Source: Ambulatory Visit | Attending: Surgery | Admitting: Surgery

## 2012-03-15 ENCOUNTER — Ambulatory Visit (INDEPENDENT_AMBULATORY_CARE_PROVIDER_SITE_OTHER): Payer: Self-pay | Admitting: Surgery

## 2012-03-15 VITALS — BP 156/95 | HR 80 | Resp 18 | Ht 68.0 in | Wt 226.0 lb

## 2012-03-15 DIAGNOSIS — D381 Neoplasm of uncertain behavior of trachea, bronchus and lung: Secondary | ICD-10-CM

## 2012-03-15 DIAGNOSIS — Z9889 Other specified postprocedural states: Secondary | ICD-10-CM

## 2012-03-15 DIAGNOSIS — Z902 Acquired absence of lung [part of]: Secondary | ICD-10-CM

## 2012-03-15 DIAGNOSIS — D3A00 Benign carcinoid tumor of unspecified site: Secondary | ICD-10-CM

## 2012-03-15 NOTE — Progress Notes (Signed)
[address]            [place] [address]          [phone]      HPI:  Patient returns for routine postoperative follow-up having undergone right thoracotomy and right middle lobectomy on 02/04/2012. The patient's early postoperative recovery while in the hospital was notable for an uncomplicated postoperative course. Since hospital discharge the patient reports he has been feeling well. He is walking daily without significant dyspnea. He has had no cough, sputum production, or hemoptysis. He is still taking about one half of the Vicodin 3 times per day for pain.   Current Outpatient Prescriptions  Medication Sig Dispense Refill  . amLODipine-olmesartan (AZOR) 5-20 MG per tablet Take 1 tablet by mouth daily.      . cholecalciferol (VITAMIN D) 400 UNITS TABS Take 400 Units by mouth daily.      Marland Kitchen HYDROcodone-acetaminophen (VICODIN) 5-500 MG per tablet Take 1 tablet by mouth every 4 (four) hours as needed for pain.  40 tablet  0  . Loratadine 10 MG CAPS Take 1 capsule by mouth at bedtime as needed. For allergies      . Multiple Vitamins-Minerals (MENS MULTIPLUS PO) Take 1 tablet by mouth daily.      . vitamin C (ASCORBIC ACID) 250 MG tablet Take 250 mg by mouth daily.      . vitamin E 400 UNIT capsule Take 400 Units by mouth daily.      . meloxicam (MOBIC) 15 MG tablet Take 15 mg by mouth daily.        Physical Exam: BP 156/95  Pulse 80  Resp 18  Ht 5\' 8"  (1.727 m)  Wt 226 lb (102.513 kg)  BMI 34.36 kg/m2  SpO2 97% He looks well. Lung exam is clear. The right chest incision is healing well.  Diagnostic Tests:   Patient Name: Harry Padilla, Harry Padilla Accession #: [id number] DOB: 04-Feb-1956 Age: 56 Gender: M Client Name Eligha Bridegroom. [facility] Collected Date: 02/04/2012 Received Date: 02/04/2012 Physician: Evelene Croon Chart #: MRN # : [id number] Physician cc: Race:W Visit #: [id number] REPORT OF SURGICAL PATHOLOGY FINAL  DIAGNOSIS Diagnosis 1. Lung, resection (segmental or lobe), Right middle - LOW GRADE NEUROENDOCRINE TUMOR (CARCINOID TUMOR), INVOLVING THE BRONCHIAL RESECTION MARGIN 2. Bronchus, biopsy, Right middle - BRONCHIAL MARGIN, NEGATIVE FOR NEOPLASM. Microscopic Comment 1. LUNG Specimen, including laterality: Right Procedure: Segmental resection Specimen integrity (intact/disrupted): Intact Tumor site: Right mid lobe Tumor focality: Unifocal Maximum tumor size (cm): 1.5 cm, gross measurement Histologic type: Low grade neuroendocrine tumor/typical carcinoid tumor Grade: Low grade Margins: Final resection margin, negative for neoplasm. Visceral pleura invasion: No Tumor extension: Confined within lung parenchyma. Treatment effect (if treated with neoadjuvant therapy): No Lymph -Vascular invasion: Not identified Lymph nodes: Number examined - 1 ; Number N1 nodes positive - 0; Number N2 nodes positive N/A TNM code: pT1a, pN0 Ancillary studies: N/A Non-neoplastic lung: Unremarkable. Comments: Sections of the tumor show a sub-bronchial epithelial neoplastic proliferation with associated stromal fibrosis. There is no evidence of significant cytologic atypia, mitotic activity or tumor necrosis identified. The tumor cells are strongly positive for synaptophysin and chromogranin. The overall findings are diagnostic for typical carcinoid tumor. The final bronchial resection margin is negative for neoplas   *RADIOLOGY REPORT*   Clinical Data: History of right middle lobectomy on 02/04/2012 for a low grade carcinoid tumor  CHEST - 2 VIEW   Comparison: Chest x-ray of 02/29/2012   Findings: There is persistent pleural and parenchymal opacity at the right lung base after right middle lobectomy.  Slight elevation of the right hemidiaphragm remains.   Heart size is stable.  No bony abnormality is seen   IMPRESSION: Stable postop changes at the right lung base.   Original Report Authenticated  By: Juline Patch, M.D.  Impression:  He is doing well following right middle lobectomy for carcinoid tumor. This was a 1.5 cm stage I tumor that was completely resected and will require no additional therapy. The initial bronchial margin was positive and therefore I resected the bronchial staple line and closed the bronchus intermedius with interrupted sutures. This bronchial margin was negative for tumor.  Plan:  I'll plan to see him back in 3 months with a chest x-ray.

## 2012-06-13 ENCOUNTER — Other Ambulatory Visit: Payer: Self-pay | Admitting: Surgery

## 2012-06-13 DIAGNOSIS — D381 Neoplasm of uncertain behavior of trachea, bronchus and lung: Secondary | ICD-10-CM

## 2012-06-14 ENCOUNTER — Encounter: Payer: Self-pay | Admitting: Surgery

## 2012-06-14 ENCOUNTER — Ambulatory Visit (INDEPENDENT_AMBULATORY_CARE_PROVIDER_SITE_OTHER): Payer: BC Managed Care – PPO | Admitting: Surgery

## 2012-06-14 ENCOUNTER — Ambulatory Visit
Admission: RE | Admit: 2012-06-14 | Discharge: 2012-06-14 | Disposition: A | Discharge status: Home / Self Care | Payer: BC Managed Care – PPO | Source: Ambulatory Visit | Attending: Surgery | Admitting: Surgery

## 2012-06-14 VITALS — BP 128/85 | HR 63 | Resp 18 | Ht 68.0 in | Wt 216.0 lb

## 2012-06-14 DIAGNOSIS — Z902 Acquired absence of lung [part of]: Secondary | ICD-10-CM

## 2012-06-14 DIAGNOSIS — Z9889 Other specified postprocedural states: Secondary | ICD-10-CM

## 2012-06-14 DIAGNOSIS — D381 Neoplasm of uncertain behavior of trachea, bronchus and lung: Secondary | ICD-10-CM

## 2012-06-14 DIAGNOSIS — D3A00 Benign carcinoid tumor of unspecified site: Secondary | ICD-10-CM

## 2012-06-14 NOTE — Progress Notes (Signed)
                   [address]            [place] [address]          [phone]       HPI:  He returns today for followup status post right thoracotomy and right middle lobectomy on 02/04/2012 for a T1A, N0 low-grade neuroendocrine tumor. Surgical margins were negative. Since I last saw him he has been doing well. He has minimal residual chest discomfort and not taking any pain medication. He is back to his normal activity level. He denies any cough or sputum production. He had no shortness of breath. He denies any headaches or visual changes. His appetite has been good and his weight is stable.  Current Outpatient Prescriptions  Medication Sig Dispense Refill  . amLODipine-olmesartan (AZOR) 5-20 MG per tablet Take 1 tablet by mouth daily.      . cholecalciferol (VITAMIN D) 400 UNITS TABS Take 400 Units by mouth daily.      . Loratadine 10 MG CAPS Take 1 capsule by mouth at bedtime as needed. For allergies      . Multiple Vitamins-Minerals (MENS MULTIPLUS PO) Take 1 tablet by mouth daily.      . vitamin C (ASCORBIC ACID) 250 MG tablet Take 250 mg by mouth daily.      . vitamin E 400 UNIT capsule Take 400 Units by mouth daily.         Physical Exam: BP 128/85  Pulse 63  Resp 18  Ht 5\' 8"  (1.727 m)  Wt 216 lb (97.977 kg)  BMI 32.84 kg/m2  SpO2 96% Looks well. Lungs clear. The right thoracotomy scar looks good. There are no skin lesions. There is no cervical or supraclavicular adenopathy.  Diagnostic Tests:  *RADIOLOGY REPORT*   Clinical Data: Right middle lobectomy for resection of lung mass   CHEST - 2 VIEW   Comparison: Chest x-ray of 03/15/2012   Findings: Postop hip changes in the right lung base are stable with volume loss and elevation of the right hemidiaphragm.  The left lung is clear.  Heart size is stable.  Mediastinal contours are stable.  No bony abnormality is seen.   IMPRESSION: Stable postoperative change at the right lung base.     Original Report Authenticated By: Juline Patch, M.D.    Impression:  He is doing well at a little over 4 months following a right middle lobectomy for a carcinoid tumor. I told him he can return to normal activity.  Plan:  He will return to see me in about 3 months which would be the middle of January 2014 and I will do a followup CT scan of the chest at that time.

## 2012-06-14 NOTE — Patient Instructions (Addendum)
No restrictions. Return to normal activity.

## 2012-09-15 ENCOUNTER — Other Ambulatory Visit: Payer: Self-pay | Admitting: Surgery

## 2012-09-15 DIAGNOSIS — D381 Neoplasm of uncertain behavior of trachea, bronchus and lung: Secondary | ICD-10-CM

## 2012-10-04 ENCOUNTER — Ambulatory Visit: Payer: BC Managed Care – PPO | Admitting: Surgery

## 2012-10-04 ENCOUNTER — Ambulatory Visit
Admission: RE | Admit: 2012-10-04 | Discharge: 2012-10-04 | Disposition: A | Discharge status: Home / Self Care | Payer: BC Managed Care – PPO | Source: Ambulatory Visit | Attending: Surgery | Admitting: Surgery

## 2012-10-04 DIAGNOSIS — D381 Neoplasm of uncertain behavior of trachea, bronchus and lung: Secondary | ICD-10-CM

## 2012-10-18 ENCOUNTER — Ambulatory Visit: Payer: BC Managed Care – PPO | Admitting: Surgery

## 2012-10-21 ENCOUNTER — Encounter: Payer: Self-pay | Admitting: Surgery

## 2012-10-21 ENCOUNTER — Ambulatory Visit (INDEPENDENT_AMBULATORY_CARE_PROVIDER_SITE_OTHER): Payer: BC Managed Care – PPO | Admitting: Surgery

## 2012-10-21 VITALS — BP 136/91 | HR 79 | Resp 16 | Ht 68.0 in | Wt 198.0 lb

## 2012-10-21 DIAGNOSIS — C342 Malignant neoplasm of middle lobe, bronchus or lung: Secondary | ICD-10-CM

## 2012-10-21 DIAGNOSIS — Z09 Encounter for follow-up examination after completed treatment for conditions other than malignant neoplasm: Secondary | ICD-10-CM

## 2012-10-21 NOTE — Progress Notes (Signed)
[address]            [place] [address]          [phone]      HPI:  He returns today for followup status post right thoracotomy and right middle lobectomy on 02/04/2012 for a T1A, N0 low-grade neuroendocrine tumor. Surgical margins were negative. Since I last saw him he has been doing well. He has minimal residual chest discomfort and not taking any pain medication. He is back to his normal activity level. He denies any cough or sputum production. He had no shortness of breath. He denies any headaches or visual changes. His appetite has been good and he has lost 20 lbs by watching his diet.  Current Outpatient Prescriptions  Medication Sig Dispense Refill  . amLODipine (NORVASC) 5 MG tablet Take 5 mg by mouth daily.      . cholecalciferol (VITAMIN D) 400 UNITS TABS Take 400 Units by mouth daily.      . Loratadine 10 MG CAPS Take 1 capsule by mouth at bedtime as needed. For allergies      . losartan (COZAAR) 100 MG tablet Take 100 mg by mouth daily.      . Multiple Vitamins-Minerals (MENS MULTIPLUS PO) Take 1 tablet by mouth daily.      . vitamin C (ASCORBIC ACID) 250 MG tablet Take 250 mg by mouth daily.      . vitamin E 400 UNIT capsule Take 400 Units by mouth daily.         Physical Exam: BP 136/91  Pulse 79  Resp 16  Ht 5\' 8"  (1.727 m)  Wt 198 lb (89.812 kg)  BMI 30.11 kg/m2  SpO2 96% He looks well. There is no cervical or supraclavicular adenopathy. Lung exam is clear. Cardiac exam shows a regular rate and rhythm with normal heart sounds. The right chest incision is well-healed. There are no skin lesions. Abdominal exam shows active bowel sounds. His abdomen is soft and nontender. There are no palpable masses or organomegaly.  Diagnostic Tests:  *RADIOLOGY REPORT*   Clinical Data: Right middle lobectomy for removal of right middle lobe lung lesions, by history carcinoid of the lung   CT CHEST WITHOUT CONTRAST  10/04/2012     Technique:  Multidetector CT imaging of the chest was performed following the standard protocol without IV contrast.   Comparison: PET CT of 02/01/2012 and chest x-ray of 06/14/2012   Findings: On the lung window images there is linear scarring the posterior right lower lobe in this patient who has undergone right middle lobectomy for resection of carcinoid tumor.  The lungs are well-aerated and no new pulmonary nodule is seen.  There is no evidence of pleural effusion.  The central airway is patent. Fracture of the right lateral seventh rib is noted.   On soft tissue window images, no definite mediastinal or hilar adenopathy seen on this unenhanced study.  Coronary artery calcifications are present in the distribution of the left anterior descending and right coronary arteries.  Images through the upper abdomen show a calcification within the right renal parenchyma and probable renal cysts, difficult to assess without IV contrast media. No acute skeletal abnormality is seen.   IMPRESSION:   1.  No evidence of recurrence of carcinoma. 2.  Right lower lobe linear scarring status post right middle lobectomy. 3. Age advanced coronary artery calcifications  as noted above.     Original Report Authenticated By: Dwyane Dee, M.D.          Impression:  He continues to do well following right middle lobectomy on 02/04/2012 for a stage I carcinoid tumor. There is no evidence of recurrence on CT scan done last month.  Plan:  I will plan to see him back in 6 months with a chest x-ray.

## 2013-04-18 ENCOUNTER — Ambulatory Visit: Payer: BC Managed Care – PPO | Admitting: Surgery

## 2013-04-18 ENCOUNTER — Other Ambulatory Visit: Payer: Self-pay | Admitting: *Deleted

## 2013-04-18 DIAGNOSIS — R918 Other nonspecific abnormal finding of lung field: Secondary | ICD-10-CM

## 2013-04-19 ENCOUNTER — Encounter: Payer: Self-pay | Admitting: Surgery

## 2013-04-19 ENCOUNTER — Ambulatory Visit (INDEPENDENT_AMBULATORY_CARE_PROVIDER_SITE_OTHER): Payer: BC Managed Care – PPO | Admitting: Surgery

## 2013-04-19 ENCOUNTER — Ambulatory Visit
Admission: RE | Admit: 2013-04-19 | Discharge: 2013-04-19 | Disposition: A | Discharge status: Home / Self Care | Payer: BC Managed Care – PPO | Source: Ambulatory Visit | Attending: Surgery | Admitting: Surgery

## 2013-04-19 VITALS — BP 135/87 | HR 75 | Resp 18 | Ht 68.0 in | Wt 200.0 lb

## 2013-04-19 DIAGNOSIS — R918 Other nonspecific abnormal finding of lung field: Secondary | ICD-10-CM

## 2013-04-19 DIAGNOSIS — D3A00 Benign carcinoid tumor of unspecified site: Secondary | ICD-10-CM

## 2013-04-19 DIAGNOSIS — Z902 Acquired absence of lung [part of]: Secondary | ICD-10-CM

## 2013-04-19 DIAGNOSIS — Z9889 Other specified postprocedural states: Secondary | ICD-10-CM

## 2013-04-19 NOTE — Progress Notes (Signed)
      [address]       [place] [address]             [phone]         HPI:  He returns today for followup status post right thoracotomy and right middle lobectomy on 02/04/2012 for a T1A, N0 low-grade neuroendocrine tumor. Surgical margins were negative. Since I last saw him he has been doing well. He denies any cough or sputum production. He denies hemoptysis. He had no shortness of breath. He denies any headaches or visual changes. His appetite has been good and his weight is stable. He fell a couple months ago breaking some ribs on the left side but that has healed.      Current Outpatient Prescriptions  Medication Sig Dispense Refill  . amLODipine (NORVASC) 5 MG tablet Take 5 mg by mouth daily.      . cholecalciferol (VITAMIN D) 400 UNITS TABS Take 400 Units by mouth daily.      . Loratadine 10 MG CAPS Take 1 capsule by mouth at bedtime as needed. For allergies      . losartan (COZAAR) 100 MG tablet Take 100 mg by mouth daily.      . Multiple Vitamins-Minerals (MENS MULTIPLUS PO) Take 1 tablet by mouth daily.      . vitamin C (ASCORBIC ACID) 250 MG tablet Take 250 mg by mouth daily.      . vitamin E 400 UNIT capsule Take 400 Units by mouth daily.       No current facility-administered medications for this visit.     Physical Exam: BP 135/87  Pulse 75  Resp 18  Ht 5\' 8"  (1.727 m)  Wt 200 lb (90.719 kg)  BMI 30.42 kg/m2  SpO2 97% He looks well. There is no cervical or supraclavicular adenopathy. The right chest incision is well-healed. There are no skin lesions or subcutaneous nodules. Lung exam is clear. Cardiac exam shows a regular rate and rhythm with normal heart sounds.  Diagnostic Tests:   *RADIOLOGY REPORT*  Clinical Data: Right middle lobectomy for resection of right lung  mass, follow-up  CHEST - 2 VIEW  Comparison: CT chest of 10/04/2012  Findings: There is a volume loss on the right after right middle  lobectomy with linear  scarring. No active infiltrate or effusion  is seen and no evidence of recurrence of carcinoma is noted. The  heart is mildly enlarged and stable. There are degenerative  changes in the lower thoracic spine.  IMPRESSION:  Postoperative change on the right with volume loss after right  middle lobectomy. No active lung disease.  Original Report Authenticated By: Dwyane Dee, M.D.        Impression:  He is doing well status post right middle lobectomy for low-grade neuroendocrine tumor on 02/04/2012. There is no sign of recurrent disease.  Plan:  I'll see him back in 6 months with a CT scan of the chest.

## 2013-10-17 ENCOUNTER — Other Ambulatory Visit: Payer: Self-pay | Admitting: *Deleted

## 2013-10-17 DIAGNOSIS — D381 Neoplasm of uncertain behavior of trachea, bronchus and lung: Secondary | ICD-10-CM

## 2013-11-08 ENCOUNTER — Encounter: Payer: Self-pay | Admitting: Surgery

## 2013-11-08 ENCOUNTER — Ambulatory Visit
Admission: RE | Admit: 2013-11-08 | Discharge: 2013-11-08 | Disposition: A | Discharge status: Home / Self Care | Payer: BC Managed Care – PPO | Source: Ambulatory Visit | Attending: Surgery | Admitting: Surgery

## 2013-11-08 ENCOUNTER — Ambulatory Visit (INDEPENDENT_AMBULATORY_CARE_PROVIDER_SITE_OTHER): Payer: BC Managed Care – PPO | Admitting: Surgery

## 2013-11-08 VITALS — BP 140/80 | HR 80 | Resp 16 | Ht 68.0 in | Wt 222.0 lb

## 2013-11-08 DIAGNOSIS — D381 Neoplasm of uncertain behavior of trachea, bronchus and lung: Secondary | ICD-10-CM

## 2013-11-08 DIAGNOSIS — Z9889 Other specified postprocedural states: Secondary | ICD-10-CM

## 2013-11-08 DIAGNOSIS — D3A00 Benign carcinoid tumor of unspecified site: Secondary | ICD-10-CM

## 2013-11-08 DIAGNOSIS — Z902 Acquired absence of lung [part of]: Secondary | ICD-10-CM

## 2013-11-08 NOTE — Progress Notes (Signed)
HPI:  He returns today for followup status post right thoracotomy and right middle lobectomy on 02/04/2012 for a T1A, N0 low-grade neuroendocrine tumor. Surgical margins were negative. Since I last saw him he has been doing well. He denies any cough or sputum production. He denies hemoptysis. He had no shortness of breath. He denies any headaches or visual changes. His appetite has been good and he has gained 20 lbs over the past year that he attributes to overeating and not exercising.   Current Outpatient Prescriptions  Medication Sig Dispense Refill  . amLODipine (NORVASC) 5 MG tablet Take 5 mg by mouth daily.      . cholecalciferol (VITAMIN D) 400 UNITS TABS Take 400 Units by mouth daily.      . Loratadine 10 MG CAPS Take 1 capsule by mouth at bedtime as needed. For allergies      . losartan (COZAAR) 100 MG tablet Take 100 mg by mouth daily.      . Multiple Vitamins-Minerals (MENS MULTIPLUS PO) Take 1 tablet by mouth daily.      . predniSONE (DELTASONE) 10 MG tablet       . vitamin C (ASCORBIC ACID) 250 MG tablet Take 250 mg by mouth daily.      . vitamin E 400 UNIT capsule Take 400 Units by mouth daily.       No current facility-administered medications for this visit.     Physical Exam: BP 140/80  Pulse 80  Resp 16  Ht 5\' 8"  (1.727 m)  Wt 222 lb (100.699 kg)  BMI 33.76 kg/m2  SpO2 97% He looks well.  There is no cervical or supraclavicular adenopathy.  The right chest incision is well-healed. There are no skin lesions or subcutaneous nodules.  Lung exam is clear.  Cardiac exam shows a regular rate and rhythm with normal heart sounds.     Diagnostic Tests:  CLINICAL DATA: Shortness of breath with exertion. Right lower lobe  lung surgery 2 years prior for carcinoma.  EXAM:  CT CHEST WITHOUT CONTRAST  TECHNIQUE:  Multidetector CT imaging of the chest was performed following the  standard protocol without IV contrast.  COMPARISON: 10/04/2012  FINDINGS:    THORACIC INLET/BODY WALL:  No acute abnormality.  MEDIASTINUM:  Normal heart size. No pericardial effusion. Diffuse atherosclerosis,  including the coronary arteries-age advanced. No lymphadenopathy.  LUNG WINDOWS:  Status post right middle lobectomy. No evidence of recurrent mass.  There is stable thickening along the neighboring pleural  reflections. No suspicious solid pulmonary nodule. There is a 5 mm  subpleural ground-glass nodular density in the lower right upper  lobe (image 20) which is stable from previous. This nodule can be  followed the time of surveillance imaging.  UPPER ABDOMEN:  Stable 3 cm water density lesion in the upper pole left kidney,  consistent with cyst.  OSSEOUS:  No acute fracture. No suspicious lytic or blastic lesions.  Thoracotomy changes again noted on the right.  IMPRESSION:  1. Stable exam. No evidence of recurrent bronchogenic carcinoma.  2. Stable 5 mm ground-glass nodule in the right upper lobe.  3. Age advanced coronary atherosclerosis.  Electronically Signed  By: Jorje Guild M.D.  On: 11/08/2013 12:34    Impression:  There is no sign of recurrent carcinoid. There is a stable 5 mm ground-glass nodule in the right upper lobe that radiology did not comment on in the last CT scan report. This is very subtle.  Plan:  I will  see him back in 6 months with a CT scan of the chest for surveillance and to follow-up on the ground-glass nodule in the right upper lobe. I advised him to watch his diet more closely and to get more exercise.

## 2014-01-08 IMAGING — CR DG CHEST 1V PORT
1 series · 1 of 1 positions shown · non-contrast
Comparison: 02/04/2012

CLINICAL DATA: Status post VATS.

PORTABLE CHEST - 1 VIEW

[AP]
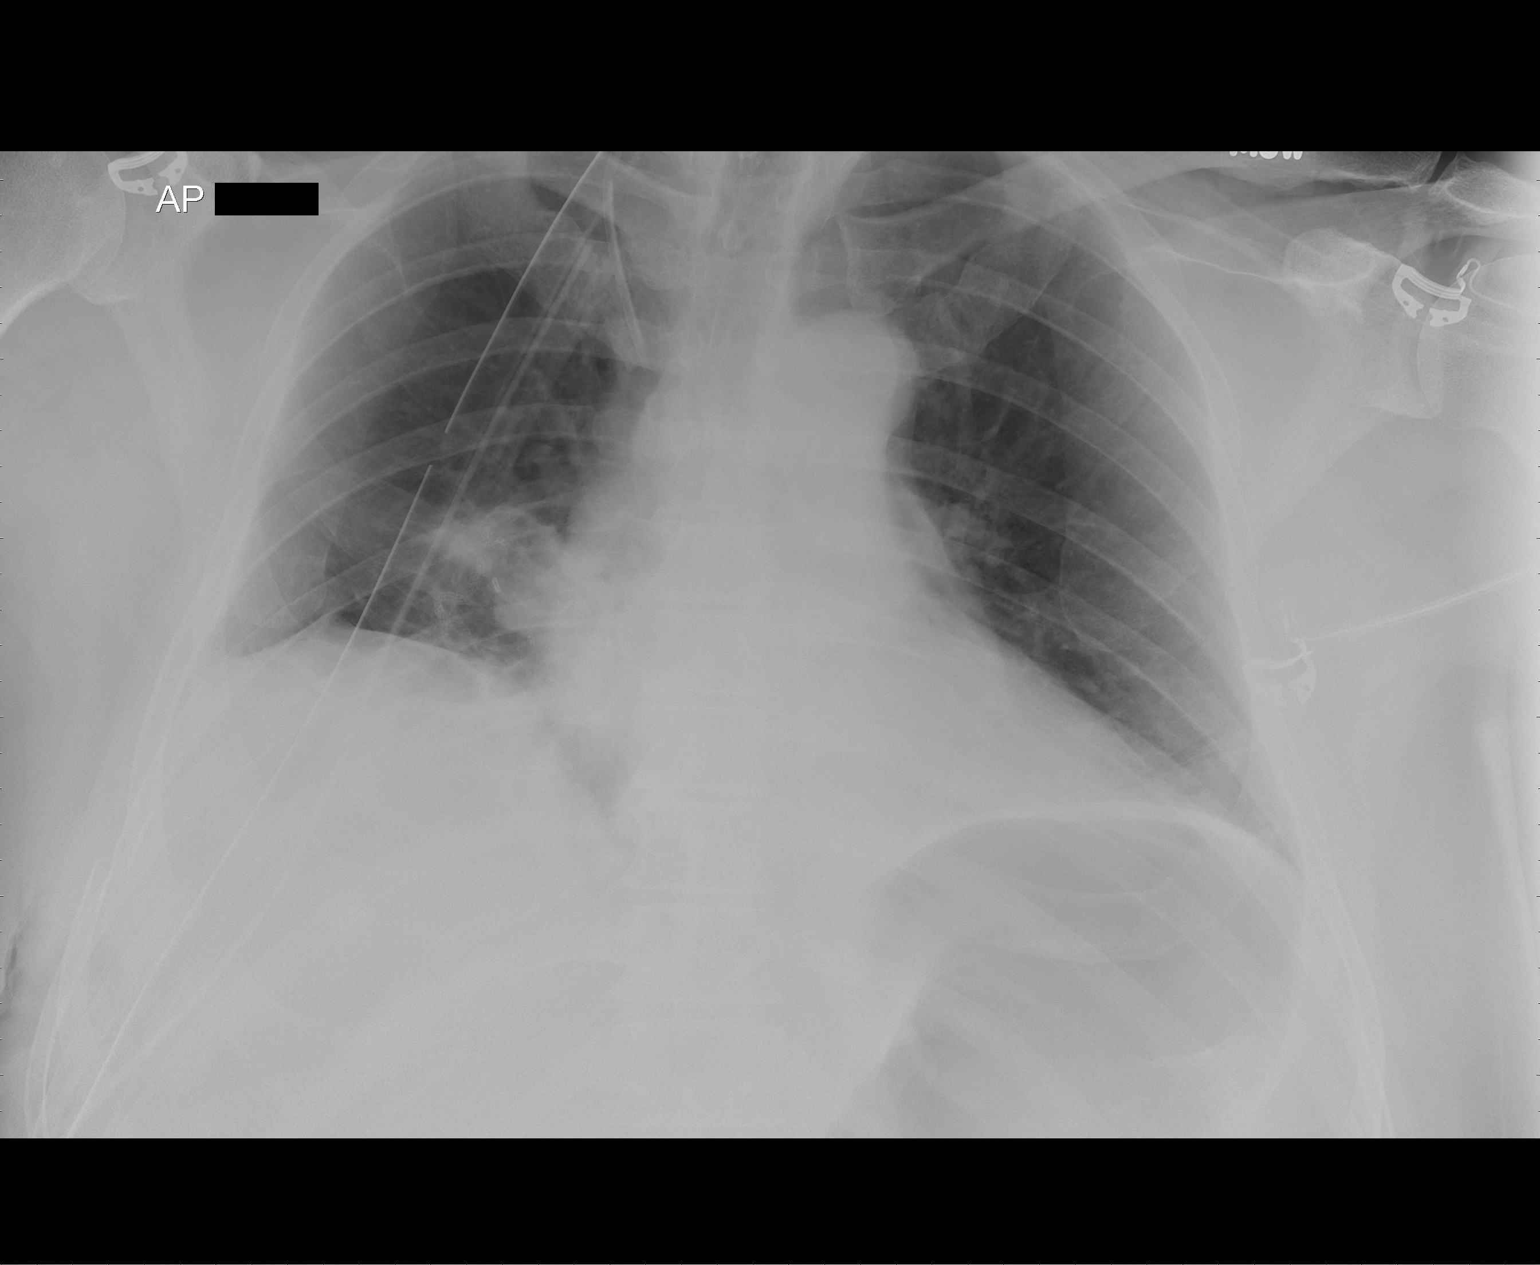

[1 of 1 positions shown; findings below may reference images not displayed]

FINDINGS: 2171 hours.  Lung volumes are low.  Right chest tube
remains in place.  Right IJ central line tip projects in the region
of the vein confluence. The cardiopericardial silhouette is
enlarged.  Bibasilar atelectasis again noted. Telemetry leads
overlie the chest.
IMPRESSION: Low volume film with cardiomegaly and bibasilar atelectasis.

## 2014-01-13 IMAGING — CR DG CHEST 2V
2 series · 2 of 2 positions shown · non-contrast
Comparison: 02/09/2012.

CLINICAL DATA: Post lung surgery, right pneumothorax, soreness.

CHEST - 2 VIEW

[w chest pa]
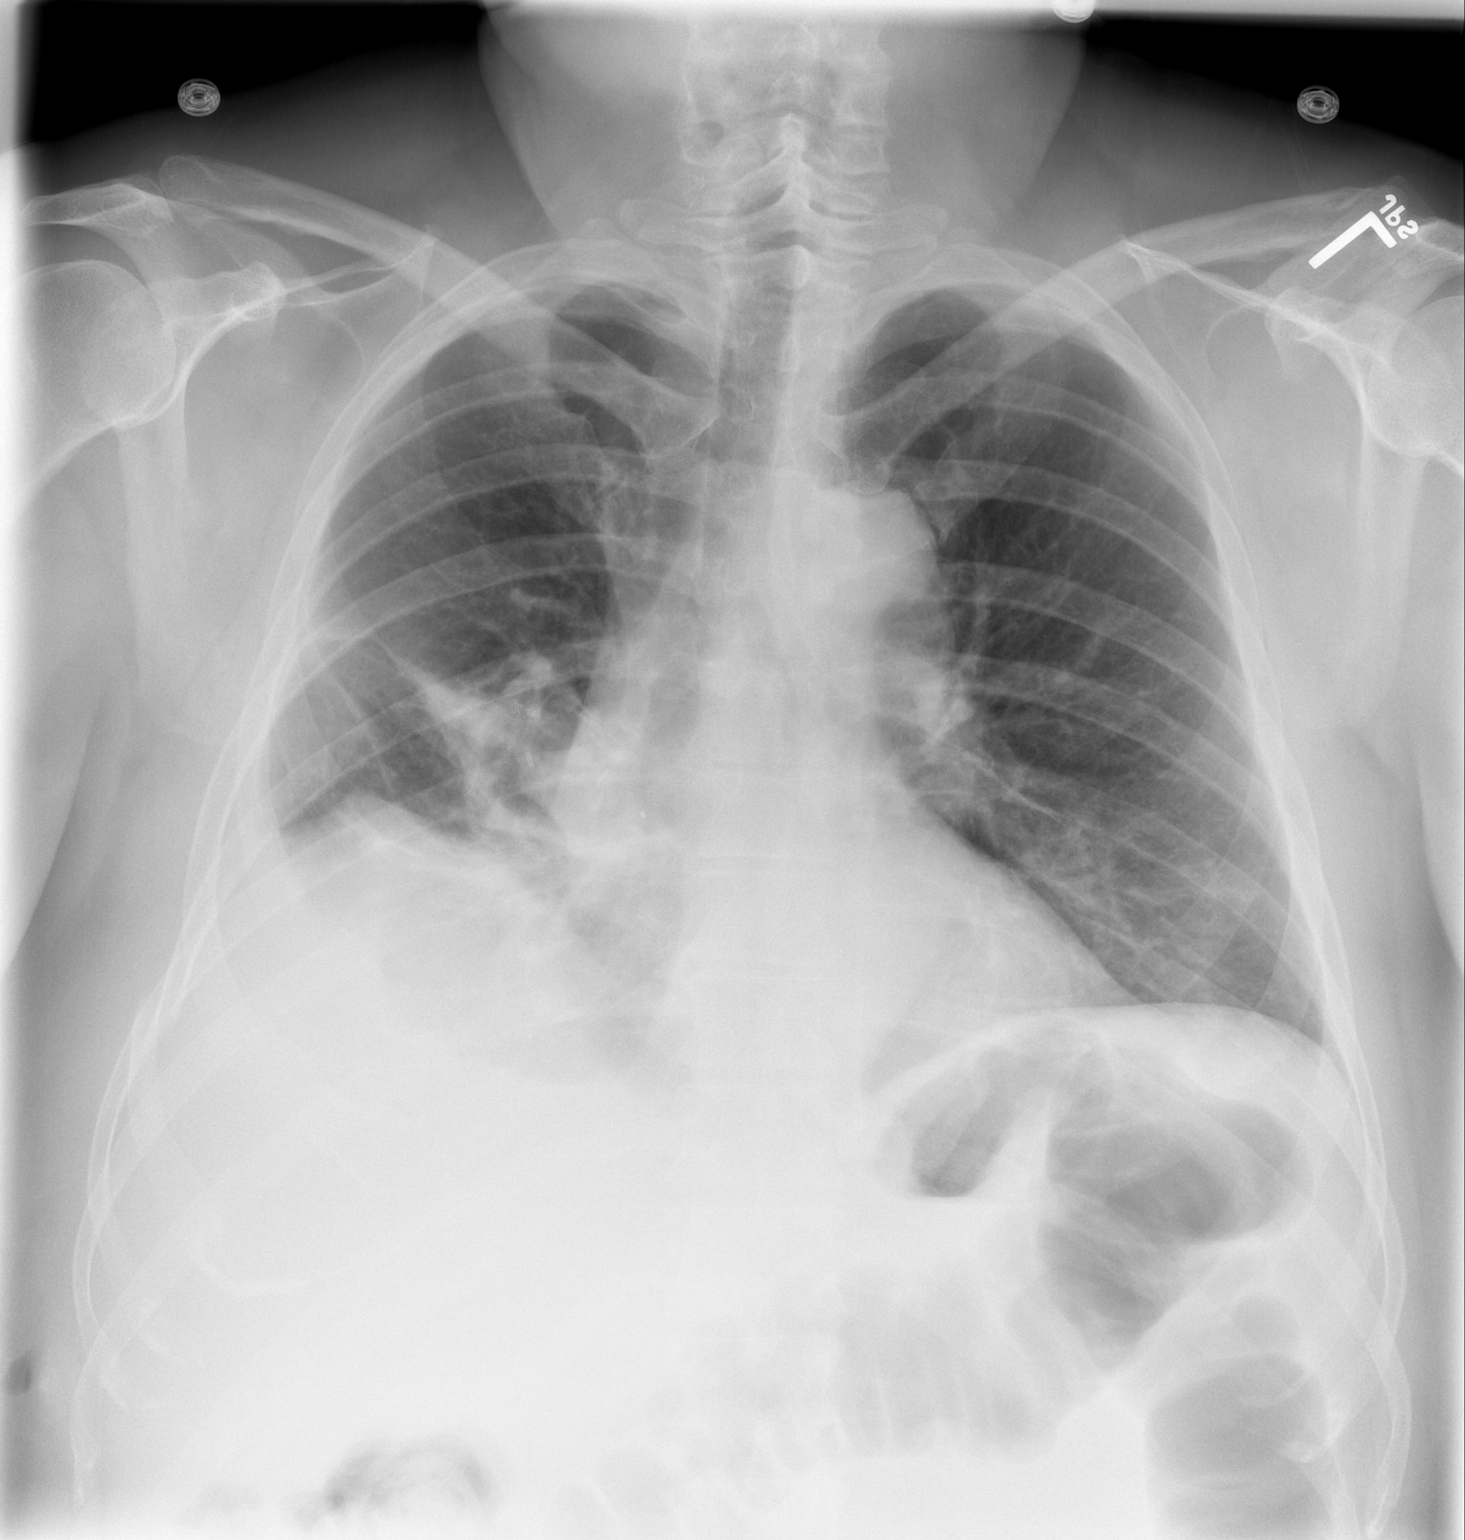

[w chest lat]
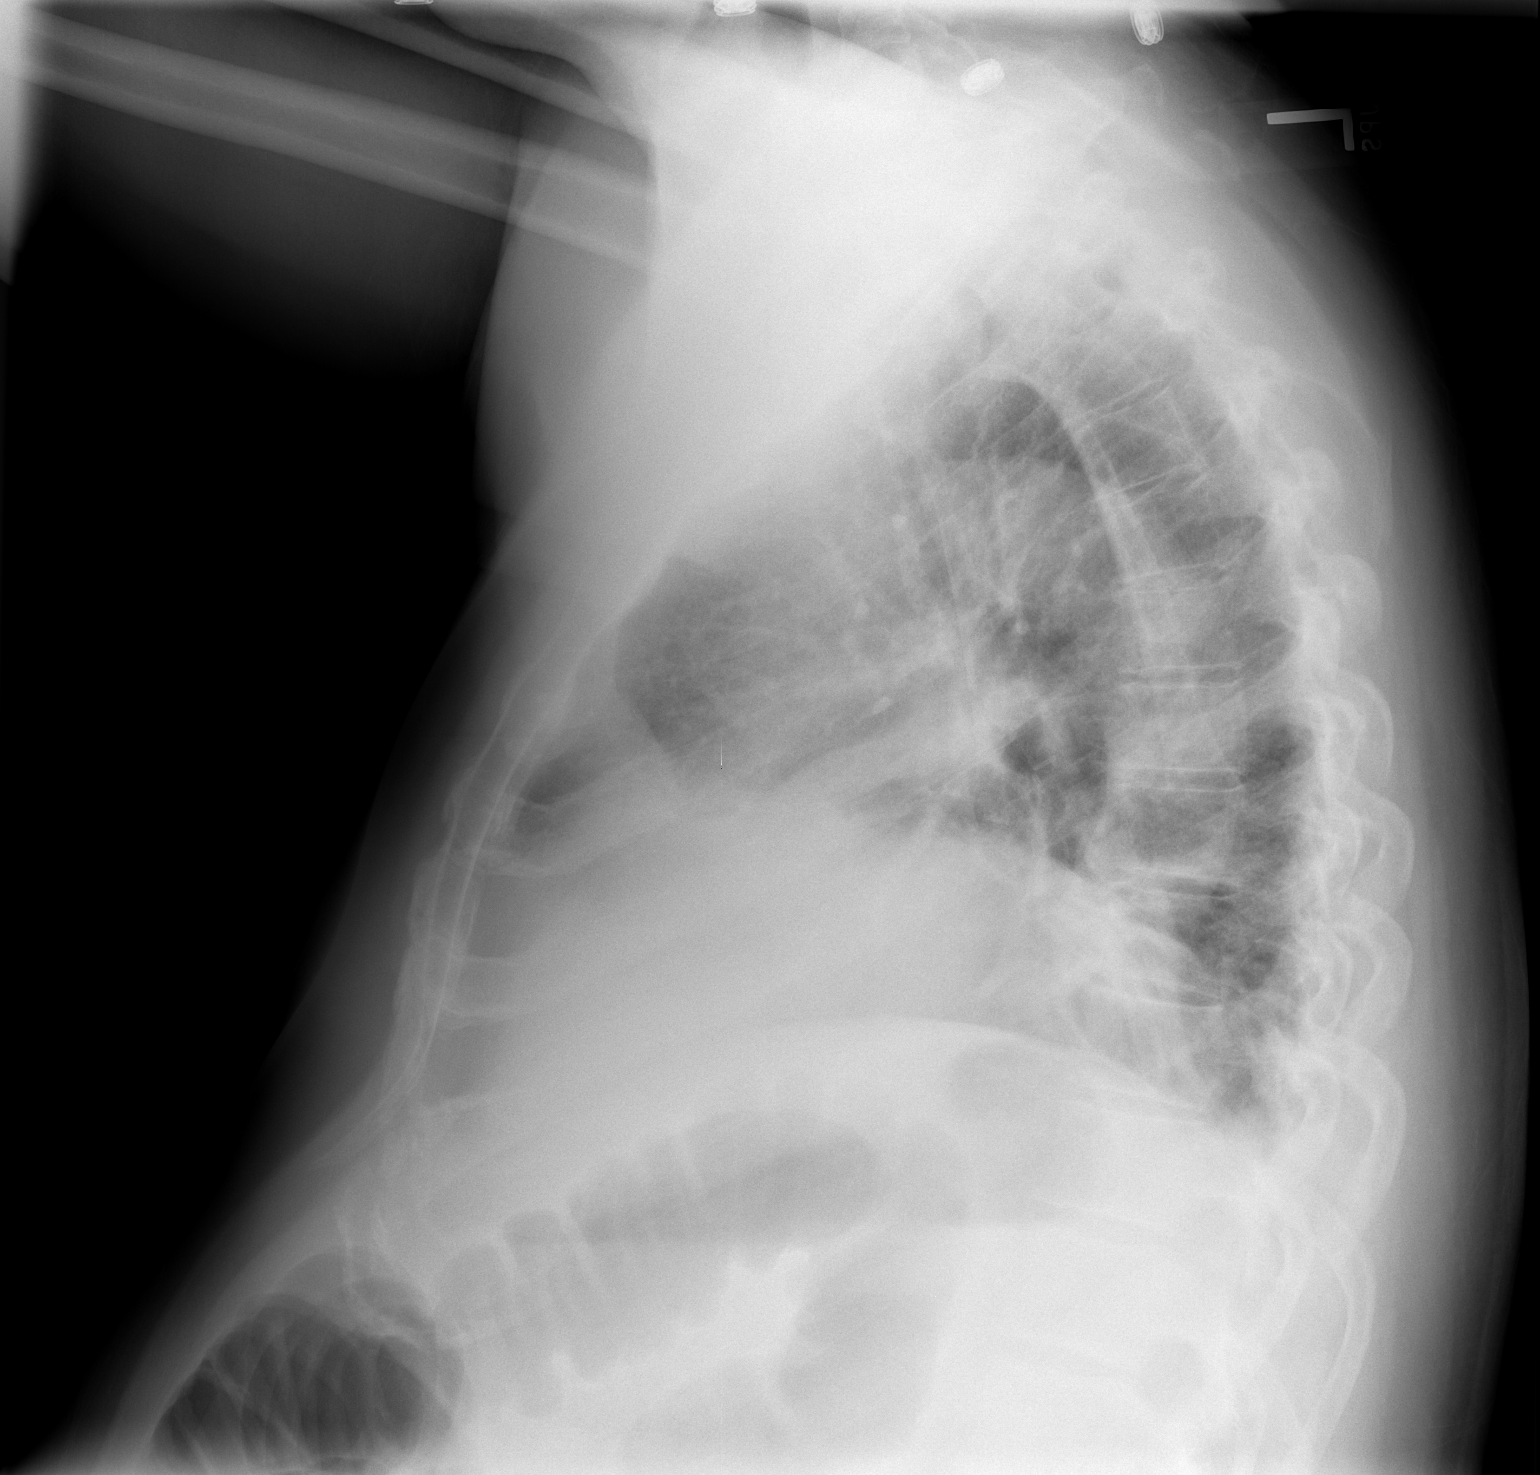

[2 of 2 positions shown; findings below may reference images not displayed]

FINDINGS: Trachea is midline.  Heart size stable.  There are
postoperative changes and volume loss in the right hemithorax,
stable.  Minimal left basilar atelectasis, with overall improved
aeration at the left lung base.  No definite pleural fluid. There
may be trace pleural air at the apex of the right hemithorax.
Thoracotomy changes or fracture involving right seventh lateral
rib.
IMPRESSION: 1.  Postoperative changes and volume loss in the right hemithorax,
stable. There may be a trace amount of pleural air at the right
apex.
2.  Improving atelectasis at the left lung base.

## 2014-04-03 ENCOUNTER — Other Ambulatory Visit: Payer: Self-pay | Admitting: *Deleted

## 2014-04-03 DIAGNOSIS — R222 Localized swelling, mass and lump, trunk: Secondary | ICD-10-CM

## 2014-05-08 ENCOUNTER — Encounter: Payer: Self-pay | Admitting: *Deleted

## 2014-05-09 ENCOUNTER — Ambulatory Visit
Admission: RE | Admit: 2014-05-09 | Discharge: 2014-05-09 | Disposition: A | Discharge status: Home / Self Care | Payer: BC Managed Care – PPO | Source: Ambulatory Visit | Attending: Surgery | Admitting: Surgery

## 2014-05-09 ENCOUNTER — Encounter: Payer: Self-pay | Admitting: Surgery

## 2014-05-09 ENCOUNTER — Ambulatory Visit (INDEPENDENT_AMBULATORY_CARE_PROVIDER_SITE_OTHER): Payer: BC Managed Care – PPO | Admitting: Surgery

## 2014-05-09 VITALS — BP 140/80 | HR 73 | Resp 20 | Ht 68.0 in | Wt 222.0 lb

## 2014-05-09 DIAGNOSIS — R911 Solitary pulmonary nodule: Secondary | ICD-10-CM

## 2014-05-09 DIAGNOSIS — D3A00 Benign carcinoid tumor of unspecified site: Secondary | ICD-10-CM

## 2014-05-09 DIAGNOSIS — R222 Localized swelling, mass and lump, trunk: Secondary | ICD-10-CM

## 2014-05-09 NOTE — Progress Notes (Signed)
HPI:  He returns today for followup status post right thoracotomy and right middle lobectomy on 02/04/2012 for a T1A, N0 low-grade neuroendocrine tumor. Surgical margins were negative. Since I last saw him he has been doing well. He denies any cough or sputum production. He denies hemoptysis. He had no shortness of breath. He denies any headaches or visual changes. 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   Current Outpatient Prescriptions  Medication Sig Dispense Refill  . amLODipine (NORVASC) 5 MG tablet Take 5 mg by mouth daily.      . cholecalciferol (VITAMIN D) 400 UNITS TABS Take 400 Units by mouth daily.      . Loratadine 10 MG CAPS Take 1 capsule by mouth at bedtime as needed. For allergies      . losartan (COZAAR) 100 MG tablet Take 100 mg by mouth daily.      . Multiple Vitamins-Minerals (MENS MULTIPLUS PO) Take 1 tablet by mouth daily.      . vitamin C (ASCORBIC ACID) 250 MG tablet Take 250 mg by mouth daily.      . vitamin E 400 UNIT capsule Take 400 Units by mouth daily.       No current facility-administered medications for this visit.     Physical Exam:  BP 140/80  Pulse 73  Resp 20  Ht 5\' 8"  (1.727 m)  Wt 222 lb (100.699 kg)  BMI 33.76 kg/m2  SpO2 98% He looks well.  There is no cervical or supraclavicular adenopathy.  The right chest incision is well-healed. There are no skin lesions or subcutaneous nodules.  Lung exam is clear.  Cardiac exam shows a regular rate and rhythm with normal heart sounds.      Diagnostic Tests:  CLINICAL DATA: Followup lung nodule. History of lung cancer.  EXAM:  CT CHEST WITHOUT CONTRAST  TECHNIQUE:  Multidetector CT imaging of the chest was performed following the  standard protocol without IV contrast.  COMPARISON: 11/08/2013, 10/04/2012 and PET 02/01/2012.  FINDINGS:  No pathologically enlarged mediastinal or axillary lymph nodes.  Hilar regions are difficult to  definitively evaluate without IV  contrast. Three-vessel coronary artery calcification. Heart size  normal. No pericardial effusion.  Postoperative changes of right middle lobectomy. 5 mm subpleural  ground-glass nodular density in the right upper lobe is unchanged  from 10/04/2012. No pleural fluid. Airway is otherwise unremarkable.  Incidental imaging of the upper abdomen shows the visualized  portions of the liver, gallbladder and adrenal glands to be grossly  unremarkable. Low-attenuation lesions in the kidneys measure up to  3.4 cm on the left. Although definitive characterization is  difficult in the absence of post-contrast imaging, the attenuation  characteristics favor cysts. Visualized portions of the spleen,  pancreas, stomach and bowel are otherwise unremarkable. No upper  abdominal adenopathy. No worrisome lytic or sclerotic lesions.  Degenerative changes are seen in the spine.  IMPRESSION:  1. 5 mm subpleural right upper lobe ground-glass nodular density is  unchanged from 10/04/2012 and likely benign.  2. Postoperative changes of right middle lobectomy.  3. Three-vessel coronary artery calcification.  Electronically Signed  By: Lorin Picket M.D.  On: 05/09/2014 12:36   Impression:  There is no sign of recurrent carcinoid. There is a stable 5 mm ground-glass nodule in the right upper lobe.   Plan:  I will see him back in 6 months with a CXR  for surveillance  and will follow-up on the ground-glass nodule in the right upper lobe in 1 year with a low dose CT of the chest. I advised him to watch his diet more closely and to get more exercise.

## 2014-11-06 ENCOUNTER — Other Ambulatory Visit: Payer: Self-pay | Admitting: Surgery

## 2014-11-06 DIAGNOSIS — D3A00 Benign carcinoid tumor of unspecified site: Secondary | ICD-10-CM

## 2014-11-07 ENCOUNTER — Ambulatory Visit
Admission: RE | Admit: 2014-11-07 | Discharge: 2014-11-07 | Disposition: A | Discharge status: Home / Self Care | Payer: BLUE CROSS/BLUE SHIELD | Source: Ambulatory Visit | Attending: Surgery | Admitting: Surgery

## 2014-11-07 ENCOUNTER — Ambulatory Visit (INDEPENDENT_AMBULATORY_CARE_PROVIDER_SITE_OTHER): Payer: BLUE CROSS/BLUE SHIELD | Admitting: Surgery

## 2014-11-07 ENCOUNTER — Encounter: Payer: Self-pay | Admitting: Surgery

## 2014-11-07 VITALS — BP 140/87 | HR 66 | Resp 16 | Ht 68.0 in | Wt 218.0 lb

## 2014-11-07 DIAGNOSIS — Z902 Acquired absence of lung [part of]: Secondary | ICD-10-CM

## 2014-11-07 DIAGNOSIS — D3A00 Benign carcinoid tumor of unspecified site: Secondary | ICD-10-CM

## 2014-11-07 DIAGNOSIS — Z9889 Other specified postprocedural states: Secondary | ICD-10-CM

## 2014-11-07 NOTE — Progress Notes (Signed)
      HPI:  He returns today for followup status post right thoracotomy and right middle lobectomy on 02/04/2012 for a T1A, N0 low-grade neuroendocrine tumor. Surgical margins were negative. Since I last saw him he has been doing well. He denies any cough or sputum production. He denies hemoptysis. He had no shortness of breath. He denies any headaches or visual changes. He describes some transient pulling sensations in the right lateral chest when lifting that he thinks might be a muscle pull but only lasts a few minutes.  Current Outpatient Prescriptions  Medication Sig Dispense Refill  . amLODipine (NORVASC) 5 MG tablet Take 5 mg by mouth daily.    . cholecalciferol (VITAMIN D) 400 UNITS TABS Take 400 Units by mouth daily.    . Loratadine 10 MG CAPS Take 1 capsule by mouth at bedtime as needed. For allergies    . losartan (COZAAR) 100 MG tablet Take 100 mg by mouth daily.    . Multiple Vitamins-Minerals (MENS MULTIPLUS PO) Take 1 tablet by mouth daily.    . vitamin C (ASCORBIC ACID) 250 MG tablet Take 250 mg by mouth daily.    . vitamin E 400 UNIT capsule Take 400 Units by mouth daily.     No current facility-administered medications for this visit.     Physical Exam: BP 140/87 mmHg  Pulse 66  Resp 16  Ht 5\' 8"  (1.727 m)  Wt 218 lb (98.884 kg)  BMI 33.15 kg/m2  SpO2 97% He looks well.  There is no cervical or supraclavicular adenopathy.  The right chest incision is well-healed. There are no skin lesions or subcutaneous nodules.  Lung exam is clear.  Cardiac exam shows a regular rate and rhythm with normal heart sounds.     Diagnostic Tests:  CLINICAL DATA: 59 year old male with right lateral chest pain for 2 months. Initial encounter. Personal history of carcinoid tumor treated surgically with right middle lobectomy in 2013.  EXAM: CHEST 2 VIEW  COMPARISON: Chest CT 05/09/2014 and earlier.  FINDINGS: Stable decreased volume in the right lung. Stable  postoperative changes about the right hilum. Mediastinal contours are stable and within normal limits otherwise. Visualized tracheal air column is within normal limits. No pneumothorax or pulmonary edema. No pleural effusion or acute pulmonary opacity. No acute osseous abnormality identified.  IMPRESSION: Stable postoperative appearance of the chest. No acute cardiopulmonary abnormality.   Electronically Signed  By: Genevie Ann M.D.  On: 11/07/2014 12:35   Impression:  He is doing well with no sign of recurrent carcinoid tumor. His last CT in August 2015 showed a 5 mm subpleural RUL ground-glass nodular density that was unchanged from the prior scan on 10/04/2012 and was felt to most likely be benign.   Plan:  I will see him back in 6 months with a CT of the chest to followup on the RUL nodular density. If it is unchanged that will give over 2 years of stability and suggests that it is most likely benign.

## 2015-03-27 ENCOUNTER — Other Ambulatory Visit: Payer: Self-pay | Admitting: *Deleted

## 2015-04-01 ENCOUNTER — Other Ambulatory Visit: Payer: Self-pay | Admitting: *Deleted

## 2015-04-01 DIAGNOSIS — D3A00 Benign carcinoid tumor of unspecified site: Secondary | ICD-10-CM

## 2015-05-07 ENCOUNTER — Other Ambulatory Visit: Payer: Self-pay | Admitting: Surgery

## 2015-05-07 DIAGNOSIS — R911 Solitary pulmonary nodule: Secondary | ICD-10-CM

## 2015-05-08 ENCOUNTER — Ambulatory Visit
Admission: RE | Admit: 2015-05-08 | Discharge: 2015-05-08 | Disposition: A | Discharge status: Home / Self Care | Payer: BLUE CROSS/BLUE SHIELD | Source: Ambulatory Visit | Attending: Surgery | Admitting: Surgery

## 2015-05-08 ENCOUNTER — Encounter: Payer: Self-pay | Admitting: Surgery

## 2015-05-08 ENCOUNTER — Ambulatory Visit (INDEPENDENT_AMBULATORY_CARE_PROVIDER_SITE_OTHER): Payer: BLUE CROSS/BLUE SHIELD | Admitting: Surgery

## 2015-05-08 VITALS — BP 130/80 | HR 64 | Resp 20 | Ht 68.0 in | Wt 215.0 lb

## 2015-05-08 DIAGNOSIS — D3A00 Benign carcinoid tumor of unspecified site: Secondary | ICD-10-CM | POA: Diagnosis not present

## 2015-05-08 DIAGNOSIS — R911 Solitary pulmonary nodule: Secondary | ICD-10-CM

## 2015-05-08 DIAGNOSIS — Z902 Acquired absence of lung [part of]: Secondary | ICD-10-CM

## 2015-05-08 DIAGNOSIS — Z9889 Other specified postprocedural states: Secondary | ICD-10-CM | POA: Diagnosis not present

## 2015-05-08 NOTE — Progress Notes (Signed)
     HPI:  He returns today for followup status post right thoracotomy and right middle lobectomy on 02/04/2012 for a T1A, N0 low-grade neuroendocrine tumor. Surgical margins were negative. Since I last saw him he has been doing well. He denies any cough or sputum production. He denies hemoptysis. He had no shortness of breath. He denies any headaches or visual changes. He has lost 20-25 lbs by watching his diet closely.  Current Outpatient Prescriptions  Medication Sig Dispense Refill  . amLODipine (NORVASC) 5 MG tablet Take 5 mg by mouth daily.    . cholecalciferol (VITAMIN D) 400 UNITS TABS Take 400 Units by mouth daily.    . Loratadine 10 MG CAPS Take 1 capsule by mouth at bedtime as needed. For allergies    . losartan (COZAAR) 100 MG tablet Take 100 mg by mouth daily.    . Multiple Vitamins-Minerals (MENS MULTIPLUS PO) Take 1 tablet by mouth daily.    . vitamin C (ASCORBIC ACID) 250 MG tablet Take 250 mg by mouth daily.    . vitamin E 400 UNIT capsule Take 400 Units by mouth daily.     No current facility-administered medications for this visit.     Physical Exam: BP 130/80 mmHg  Pulse 64  Resp 20  Ht '5\' 8"'$  (1.727 m)  Wt 215 lb (97.523 kg)  BMI 32.70 kg/m2  SpO2  He looks well.  There is no cervical or supraclavicular adenopathy.  The right chest incision is well-healed. There are no skin lesions or subcutaneous nodules.  Lung exam is clear.  Cardiac exam shows a regular rate and rhythm with normal heart sounds.   Diagnostic Tests:  CT of the chest done today has not been officially read by radiology but I have reviewed it and I see no sign of recurrent tumor. The subpleural RUL ground-glass nodular density is unchanged dating back to 09/2012   Impression:  He is doing well with no sign of recurrent carcinoid tumor. The subpleural RUL ground-glass nodular density is unchanged dating back to 09/2012 and is likely benign.  Plan:  I will see him back in 6 months with  a CXR.    Gaye Pollack, MD [facility] [phone]

## 2015-05-12 ENCOUNTER — Encounter: Payer: Self-pay | Admitting: Surgery

## 2015-08-07 ENCOUNTER — Other Ambulatory Visit: Payer: Self-pay | Admitting: Ophthalmology

## 2015-08-07 ENCOUNTER — Ambulatory Visit (INDEPENDENT_AMBULATORY_CARE_PROVIDER_SITE_OTHER): Payer: BLUE CROSS/BLUE SHIELD | Admitting: Family Medicine

## 2015-08-07 ENCOUNTER — Ambulatory Visit (HOSPITAL_COMMUNITY): Payer: BLUE CROSS/BLUE SHIELD | Admitting: Anesthesiology

## 2015-08-07 ENCOUNTER — Observation Stay (HOSPITAL_COMMUNITY)
Admission: RE | Admit: 2015-08-07 | Discharge: 2015-08-08 | Disposition: A | Discharge status: Home / Self Care | Payer: BLUE CROSS/BLUE SHIELD | Source: Ambulatory Visit | Attending: Ophthalmology | Admitting: Ophthalmology

## 2015-08-07 ENCOUNTER — Encounter (HOSPITAL_COMMUNITY): Payer: Self-pay | Admitting: Anesthesiology

## 2015-08-07 ENCOUNTER — Encounter (HOSPITAL_COMMUNITY)
Admission: RE | Disposition: A | Discharge status: Home / Self Care | Payer: Self-pay | Source: Ambulatory Visit | Attending: Ophthalmology

## 2015-08-07 ENCOUNTER — Encounter: Payer: Self-pay | Admitting: Family Medicine

## 2015-08-07 VITALS — BP 142/84 | HR 60 | Temp 98.5°F | Resp 16 | Ht 67.5 in | Wt 210.0 lb

## 2015-08-07 DIAGNOSIS — Z125 Encounter for screening for malignant neoplasm of prostate: Secondary | ICD-10-CM

## 2015-08-07 DIAGNOSIS — I10 Essential (primary) hypertension: Secondary | ICD-10-CM | POA: Insufficient documentation

## 2015-08-07 DIAGNOSIS — H43392 Other vitreous opacities, left eye: Secondary | ICD-10-CM | POA: Insufficient documentation

## 2015-08-07 DIAGNOSIS — H33032 Retinal detachment with giant retinal tear, left eye: Principal | ICD-10-CM | POA: Insufficient documentation

## 2015-08-07 DIAGNOSIS — Z0000 Encounter for general adult medical examination without abnormal findings: Secondary | ICD-10-CM

## 2015-08-07 DIAGNOSIS — G4730 Sleep apnea, unspecified: Secondary | ICD-10-CM | POA: Diagnosis not present

## 2015-08-07 DIAGNOSIS — E041 Nontoxic single thyroid nodule: Secondary | ICD-10-CM

## 2015-08-07 DIAGNOSIS — H539 Unspecified visual disturbance: Secondary | ICD-10-CM | POA: Diagnosis not present

## 2015-08-07 DIAGNOSIS — H268 Other specified cataract: Secondary | ICD-10-CM | POA: Diagnosis not present

## 2015-08-07 DIAGNOSIS — Z1211 Encounter for screening for malignant neoplasm of colon: Secondary | ICD-10-CM | POA: Diagnosis not present

## 2015-08-07 DIAGNOSIS — Z880 Allergy status to penicillin: Secondary | ICD-10-CM | POA: Insufficient documentation

## 2015-08-07 DIAGNOSIS — Z419 Encounter for procedure for purposes other than remedying health state, unspecified: Secondary | ICD-10-CM

## 2015-08-07 HISTORY — PX: PHOTOCOAGULATION WITH LASER: SHX6027

## 2015-08-07 HISTORY — DX: Disease of esophagus, unspecified: K22.9

## 2015-08-07 HISTORY — PX: PARS PLANA VITRECTOMY: SHX2166

## 2015-08-07 HISTORY — DX: Malignant (primary) neoplasm, unspecified: C80.1

## 2015-08-07 HISTORY — PX: GAS INSERTION: SHX5336

## 2015-08-07 LAB — CBC WITH DIFFERENTIAL/PLATELET
BASOS ABS: 0.1 10*3/uL (ref 0.0–0.1)
Basophils Relative: 1 %
Eosinophils Absolute: 0.4 10*3/uL (ref 0.0–0.7)
Eosinophils Relative: 4 %
HEMATOCRIT: 44.8 % (ref 39.0–52.0)
Hemoglobin: 15.9 g/dL (ref 13.0–17.0)
LYMPHS PCT: 29 %
Lymphs Abs: 2.4 10*3/uL (ref 0.7–4.0)
MCH: 31.4 pg (ref 26.0–34.0)
MCHC: 35.5 g/dL (ref 30.0–36.0)
MCV: 88.4 fL (ref 78.0–100.0)
MONO ABS: 0.7 10*3/uL (ref 0.1–1.0)
Monocytes Relative: 9 %
NEUTROS ABS: 4.6 10*3/uL (ref 1.7–7.7)
Neutrophils Relative %: 57 %
Platelets: 256 10*3/uL (ref 150–400)
RBC: 5.07 MIL/uL (ref 4.22–5.81)
RDW: 13.5 % (ref 11.5–15.5)
WBC: 8.1 10*3/uL (ref 4.0–10.5)

## 2015-08-07 LAB — COMPREHENSIVE METABOLIC PANEL
ALK PHOS: 68 U/L (ref 38–126)
ALT: 25 U/L (ref 17–63)
ANION GAP: 9 (ref 5–15)
AST: 27 U/L (ref 15–41)
Albumin: 4.3 g/dL (ref 3.5–5.0)
BUN: 13 mg/dL (ref 6–20)
CO2: 25 mmol/L (ref 22–32)
Calcium: 9.7 mg/dL (ref 8.9–10.3)
Chloride: 106 mmol/L (ref 101–111)
Creatinine, Ser: 0.85 mg/dL (ref 0.61–1.24)
GLUCOSE: 84 mg/dL (ref 65–99)
Potassium: 3.6 mmol/L (ref 3.5–5.1)
SODIUM: 140 mmol/L (ref 135–145)
TOTAL PROTEIN: 8.1 g/dL (ref 6.5–8.1)
Total Bilirubin: 0.8 mg/dL (ref 0.3–1.2)

## 2015-08-07 LAB — IFOBT (OCCULT BLOOD): IFOBT: NEGATIVE

## 2015-08-07 SURGERY — PARS PLANA VITRECTOMY WITH 25 GAUGE
Anesthesia: General | Site: Eye | Laterality: Left

## 2015-08-07 MED ORDER — GATIFLOXACIN 0.5 % OP SOLN
OPHTHALMIC | Status: AC
  Administered 2015-08-07 (×3): 1 [drp] via OPHTHALMIC
  Filled 2015-08-07: qty 2.5

## 2015-08-07 MED ORDER — GLYCOPYRROLATE 0.2 MG/ML IJ SOLN
INTRAMUSCULAR | Status: AC
  Filled 2015-08-07: qty 1

## 2015-08-07 MED ORDER — INDOCYANINE GREEN 25 MG IV SOLR
2.5000 mg | Freq: Once | INTRAVENOUS | Status: DC
  Filled 2015-08-07 (×3): qty 25

## 2015-08-07 MED ORDER — SUGAMMADEX SODIUM 500 MG/5ML IV SOLN
INTRAVENOUS | Status: DC | PRN
  Administered 2015-08-07: 187.8 mg via INTRAVENOUS

## 2015-08-07 MED ORDER — DEXAMETHASONE SODIUM PHOSPHATE 10 MG/ML IJ SOLN
INTRAMUSCULAR | Status: AC
  Filled 2015-08-07: qty 1

## 2015-08-07 MED ORDER — NA CHONDROIT SULF-NA HYALURON 40-30 MG/ML IO SOLN
INTRAOCULAR | Status: AC
  Filled 2015-08-07: qty 0.5

## 2015-08-07 MED ORDER — POLYMYXIN B SULFATE 500000 UNITS IJ SOLR
INTRAMUSCULAR | Status: AC
  Filled 2015-08-07: qty 1

## 2015-08-07 MED ORDER — BSS IO SOLN
INTRAOCULAR | Status: DC | PRN
  Administered 2015-08-07: 15 mL via INTRAOCULAR

## 2015-08-07 MED ORDER — IBUPROFEN 600 MG PO TABS
600.0000 mg | ORAL_TABLET | Freq: Three times a day (TID) | ORAL | Status: DC | PRN
Start: 2015-08-07 — End: 2015-08-08

## 2015-08-07 MED ORDER — ONDANSETRON HCL 4 MG/2ML IJ SOLN
INTRAMUSCULAR | Status: AC
  Filled 2015-08-07: qty 2

## 2015-08-07 MED ORDER — ACETAMINOPHEN 160 MG/5ML PO SOLN: 325.0000 mg | ORAL | Status: DC | PRN

## 2015-08-07 MED ORDER — SUGAMMADEX SODIUM 500 MG/5ML IV SOLN
INTRAVENOUS | Status: AC
  Filled 2015-08-07: qty 5

## 2015-08-07 MED ORDER — CYCLOPENTOLATE HCL 1 % OP SOLN: 1.0000 [drp] | OPHTHALMIC | Status: AC

## 2015-08-07 MED ORDER — FENTANYL CITRATE (PF) 100 MCG/2ML IJ SOLN
INTRAMUSCULAR | Status: DC | PRN
  Administered 2015-08-07: 150 ug via INTRAVENOUS

## 2015-08-07 MED ORDER — BSS PLUS IO SOLN
INTRAOCULAR | Status: AC
  Filled 2015-08-07: qty 500

## 2015-08-07 MED ORDER — GENTAMICIN SULFATE 40 MG/ML IJ SOLN
INTRAMUSCULAR | Status: AC
  Filled 2015-08-07: qty 2

## 2015-08-07 MED ORDER — PHENYLEPHRINE 40 MCG/ML (10ML) SYRINGE FOR IV PUSH (FOR BLOOD PRESSURE SUPPORT)
PREFILLED_SYRINGE | INTRAVENOUS | Status: AC
  Filled 2015-08-07: qty 10

## 2015-08-07 MED ORDER — SODIUM CHLORIDE 0.9 % IV SOLN
INTRAVENOUS | Status: DC
  Administered 2015-08-07: 16:00:00 via INTRAVENOUS

## 2015-08-07 MED ORDER — GATIFLOXACIN 0.5 % OP SOLN: 1.0000 [drp] | OPHTHALMIC | Status: DC | PRN

## 2015-08-07 MED ORDER — SUGAMMADEX SODIUM 200 MG/2ML IV SOLN
INTRAVENOUS | Status: AC
  Filled 2015-08-07: qty 2

## 2015-08-07 MED ORDER — LIDOCAINE HCL 2 % IJ SOLN
INTRAMUSCULAR | Status: AC
  Filled 2015-08-07: qty 20

## 2015-08-07 MED ORDER — DEXAMETHASONE SODIUM PHOSPHATE 10 MG/ML IJ SOLN
INTRAMUSCULAR | Status: DC | PRN
  Administered 2015-08-07: 10 mg

## 2015-08-07 MED ORDER — TETRACAINE HCL 0.5 % OP SOLN
OPHTHALMIC | Status: AC
  Filled 2015-08-07: qty 2

## 2015-08-07 MED ORDER — OXYCODONE HCL 5 MG PO TABS: 5.0000 mg | ORAL_TABLET | Freq: Once | ORAL | Status: DC | PRN

## 2015-08-07 MED ORDER — PROPOFOL 10 MG/ML IV BOLUS
INTRAVENOUS | Status: DC | PRN
  Administered 2015-08-07: 40 mg via INTRAVENOUS
  Administered 2015-08-07: 160 mg via INTRAVENOUS

## 2015-08-07 MED ORDER — HYPROMELLOSE (GONIOSCOPIC) 2.5 % OP SOLN
OPHTHALMIC | Status: AC
  Filled 2015-08-07: qty 15

## 2015-08-07 MED ORDER — BSS PLUS IO SOLN
INTRAOCULAR | Status: DC | PRN
  Administered 2015-08-07: 1

## 2015-08-07 MED ORDER — PROPOFOL 10 MG/ML IV BOLUS
INTRAVENOUS | Status: AC
  Filled 2015-08-07: qty 20

## 2015-08-07 MED ORDER — LIDOCAINE HCL (CARDIAC) 20 MG/ML IV SOLN
INTRAVENOUS | Status: DC | PRN
  Administered 2015-08-07: 40 mg via INTRAVENOUS

## 2015-08-07 MED ORDER — ONDANSETRON HCL 4 MG/2ML IJ SOLN
INTRAMUSCULAR | Status: DC | PRN
Start: 2015-08-07 — End: 2015-08-07
  Administered 2015-08-07: 4 mg via INTRAVENOUS

## 2015-08-07 MED ORDER — OXYCODONE HCL 5 MG/5ML PO SOLN: 5.0000 mg | Freq: Once | ORAL | Status: DC | PRN

## 2015-08-07 MED ORDER — PHENYLEPHRINE HCL 2.5 % OP SOLN: 1.0000 [drp] | OPHTHALMIC | Status: DC | PRN

## 2015-08-07 MED ORDER — EPINEPHRINE HCL 1 MG/ML IJ SOLN
INTRAMUSCULAR | Status: DC | PRN
  Administered 2015-08-07: .3 mL

## 2015-08-07 MED ORDER — CYCLOPENTOLATE HCL 1 % OP SOLN: 1.0000 [drp] | OPHTHALMIC | Status: DC

## 2015-08-07 MED ORDER — PHENYLEPHRINE HCL 2.5 % OP SOLN
OPHTHALMIC | Status: AC
  Administered 2015-08-07: 1 [drp] via OPHTHALMIC
  Filled 2015-08-07: qty 2

## 2015-08-07 MED ORDER — MENTHOL 3 MG MT LOZG
1.0000 | LOZENGE | OROMUCOSAL | Status: DC | PRN
  Administered 2015-08-07: 3 mg via ORAL
  Filled 2015-08-07: qty 9

## 2015-08-07 MED ORDER — CYCLOPENTOLATE HCL 1 % OP SOLN
1.0000 [drp] | OPHTHALMIC | Status: DC | PRN
  Administered 2015-08-07 (×2): 1 [drp] via OPHTHALMIC

## 2015-08-07 MED ORDER — ROCURONIUM BROMIDE 100 MG/10ML IV SOLN
INTRAVENOUS | Status: DC | PRN
  Administered 2015-08-07: 5 mg via INTRAVENOUS
  Administered 2015-08-07: 50 mg via INTRAVENOUS

## 2015-08-07 MED ORDER — FENTANYL CITRATE (PF) 250 MCG/5ML IJ SOLN
INTRAMUSCULAR | Status: AC
  Filled 2015-08-07: qty 5

## 2015-08-07 MED ORDER — PHENYLEPHRINE HCL 10 MG/ML IJ SOLN
INTRAMUSCULAR | Status: DC | PRN
  Administered 2015-08-07: 80 ug via INTRAVENOUS

## 2015-08-07 MED ORDER — SODIUM CHLORIDE 0.9 % IJ SOLN
INTRAMUSCULAR | Status: AC
  Filled 2015-08-07: qty 10

## 2015-08-07 MED ORDER — FENTANYL CITRATE (PF) 100 MCG/2ML IJ SOLN: 25.0000 ug | INTRAMUSCULAR | Status: DC | PRN

## 2015-08-07 MED ORDER — LIDOCAINE HCL (CARDIAC) 20 MG/ML IV SOLN
INTRAVENOUS | Status: AC
  Filled 2015-08-07: qty 10

## 2015-08-07 MED ORDER — EPINEPHRINE HCL 1 MG/ML IJ SOLN
INTRAMUSCULAR | Status: AC
  Filled 2015-08-07: qty 1

## 2015-08-07 MED ORDER — HYPROMELLOSE (GONIOSCOPIC) 2.5 % OP SOLN
OPHTHALMIC | Status: DC | PRN
  Administered 2015-08-07: 1 [drp] via OPHTHALMIC

## 2015-08-07 MED ORDER — CYCLOPENTOLATE HCL 1 % OP SOLN
OPHTHALMIC | Status: AC
  Administered 2015-08-07: 1 [drp] via OPHTHALMIC
  Filled 2015-08-07: qty 2

## 2015-08-07 MED ORDER — PHENYLEPHRINE HCL 2.5 % OP SOLN
1.0000 [drp] | OPHTHALMIC | Status: DC | PRN
  Administered 2015-08-07 (×2): 1 [drp] via OPHTHALMIC

## 2015-08-07 MED ORDER — ROCURONIUM BROMIDE 50 MG/5ML IV SOLN
INTRAVENOUS | Status: AC
  Filled 2015-08-07: qty 2

## 2015-08-07 MED ORDER — ACETAMINOPHEN 325 MG PO TABS: 325.0000 mg | ORAL_TABLET | ORAL | Status: DC | PRN

## 2015-08-07 SURGICAL SUPPLY — 70 items
APL SRG 3 HI ABS STRL LF PLS (MISCELLANEOUS)
APPLICATOR COTTON TIP 6IN STRL (MISCELLANEOUS) ×2 IMPLANT
APPLICATOR DR MATTHEWS STRL (MISCELLANEOUS) IMPLANT
BLADE 10 SAFETY STRL DISP (BLADE) ×2 IMPLANT
BLADE MVR KNIFE 20G (BLADE) IMPLANT
CANNULA ANT CHAM MAIN (OPHTHALMIC RELATED) IMPLANT
CANNULA FLEX TIP 25G (CANNULA) IMPLANT
CANNULA VLV SOFT TIP 25G (OPHTHALMIC) ×1 IMPLANT
CANNULA VLV SOFT TIP 25GA (OPHTHALMIC) ×2 IMPLANT
CORDS BIPOLAR (ELECTRODE) IMPLANT
COVER MAYO STAND STRL (DRAPES) IMPLANT
DRAPE INCISE 51X51 W/FILM STRL (DRAPES) IMPLANT
DRAPE OPHTHALMIC 77X100 STRL (CUSTOM PROCEDURE TRAY) ×2 IMPLANT
FILTER BLUE MILLIPORE (MISCELLANEOUS) ×1 IMPLANT
FORCEPS ECKARDT ILM 25G SERR (OPHTHALMIC RELATED) IMPLANT
FORCEPS GRIESHABER ILM 25G A (INSTRUMENTS) IMPLANT
FORCEPS HORIZONTAL 25G DISP (OPHTHALMIC RELATED) IMPLANT
GAS AUTO FILL CONSTEL (OPHTHALMIC)
GAS AUTO FILL CONSTELLATION (OPHTHALMIC) IMPLANT
GAS OPHTHALMIC (MISCELLANEOUS) IMPLANT
GLOVE SS BIOGEL STRL SZ 8.5 (GLOVE) ×1 IMPLANT
GLOVE SUPERSENSE BIOGEL SZ 8.5 (GLOVE) ×1
GOWN STRL REUS W/ TWL LRG LVL3 (GOWN DISPOSABLE) ×1 IMPLANT
GOWN STRL REUS W/ TWL XL LVL3 (GOWN DISPOSABLE) ×1 IMPLANT
GOWN STRL REUS W/TWL LRG LVL3 (GOWN DISPOSABLE) ×2
GOWN STRL REUS W/TWL XL LVL3 (GOWN DISPOSABLE) ×2
KIT BASIN OR (CUSTOM PROCEDURE TRAY) ×2 IMPLANT
KIT PERFLUORON PROCEDURE 5ML (MISCELLANEOUS) ×1 IMPLANT
KNIFE CRESCENT 2.5 55 ANG (BLADE) IMPLANT
LENS BIOM SUPER VIEW SET DISP (OPHTHALMIC RELATED) ×2 IMPLANT
MARKER SKIN DUAL TIP RULER LAB (MISCELLANEOUS) IMPLANT
MICROPICK 25G (MISCELLANEOUS)
NDL 18GX1X1/2 (RX/OR ONLY) (NEEDLE) IMPLANT
NDL 25GX 5/8IN NON SAFETY (NEEDLE) IMPLANT
NDL FILTER BLUNT 18X1 1/2 (NEEDLE) IMPLANT
NDL HYPO 25GX1X1/2 BEV (NEEDLE) IMPLANT
NEEDLE 18GX1X1/2 (RX/OR ONLY) (NEEDLE) IMPLANT
NEEDLE 25GX 5/8IN NON SAFETY (NEEDLE) IMPLANT
NEEDLE FILTER BLUNT 18X 1/2SAF (NEEDLE)
NEEDLE FILTER BLUNT 18X1 1/2 (NEEDLE) IMPLANT
NEEDLE HYPO 25GX1X1/2 BEV (NEEDLE) IMPLANT
NS IRRIG 1000ML POUR BTL (IV SOLUTION) ×2 IMPLANT
PACK FRAGMATOME (OPHTHALMIC) IMPLANT
PACK VITRECTOMY CUSTOM (CUSTOM PROCEDURE TRAY) ×2 IMPLANT
PAD ARMBOARD 7.5X6 YLW CONV (MISCELLANEOUS) ×4 IMPLANT
PAK PIK VITRECTOMY CVS 25GA (OPHTHALMIC) ×2 IMPLANT
PAK VITRECTOMY PIK 25 GA (OPHTHALMIC RELATED) IMPLANT
PENCIL BIPOLAR 25GA STR DISP (OPHTHALMIC RELATED) IMPLANT
PICK MICROPICK 25G (MISCELLANEOUS) IMPLANT
PROBE DIRECTIONAL LASER (MISCELLANEOUS) IMPLANT
PROBE LASER ILLUM FLEX CVD 25G (OPHTHALMIC) IMPLANT
ROLLS DENTAL (MISCELLANEOUS) IMPLANT
SCRAPER DIAMOND 25GA (OPHTHALMIC RELATED) IMPLANT
SET INJECTOR OIL FLUID CONSTEL (OPHTHALMIC) IMPLANT
STOCKINETTE IMPERVIOUS 9X36 MD (GAUZE/BANDAGES/DRESSINGS) ×4 IMPLANT
STOPCOCK 4 WAY LG BORE MALE ST (IV SETS) IMPLANT
SUT ETHILON 10 0 CS140 6 (SUTURE) IMPLANT
SUT ETHILON 8 0 BV130 4 (SUTURE) IMPLANT
SUT MERSILENE 5 0 RD 1 DA (SUTURE) IMPLANT
SUT PROLENE 10 0 CIF 4 DA (SUTURE) IMPLANT
SUT VICRYL 7 0 TG140 8 (SUTURE) IMPLANT
SYR 30ML SLIP (SYRINGE) IMPLANT
SYR 50ML SLIP (SYRINGE) ×1 IMPLANT
SYR 5ML LL (SYRINGE) IMPLANT
SYR TB 1ML LUER SLIP (SYRINGE) IMPLANT
TAPE SURG TRANSPORE 1 IN (GAUZE/BANDAGES/DRESSINGS) IMPLANT
TAPE SURGICAL TRANSPORE 1 IN (GAUZE/BANDAGES/DRESSINGS) ×1
TOWEL OR 17X24 6PK STRL BLUE (TOWEL DISPOSABLE) ×4 IMPLANT
WATER STERILE IRR 1000ML POUR (IV SOLUTION) ×2 IMPLANT
WIPE INSTRUMENT VISIWIPE 73X73 (MISCELLANEOUS) ×2 IMPLANT

## 2015-08-07 NOTE — Anesthesia Preprocedure Evaluation (Addendum)
Anesthesia Evaluation  Patient identified by MRN, date of birth, ID band Patient awake    Reviewed: Allergy & Precautions, NPO status , Patient's Chart, lab work & pertinent test results  History of Anesthesia Complications Negative for: history of anesthetic complications  Airway Mallampati: III  TM Distance: >3 FB Neck ROM: Full    Dental  (+) Teeth Intact   Pulmonary sleep apnea and Continuous Positive Airway Pressure Ventilation ,    breath sounds clear to auscultation       Cardiovascular hypertension, Pt. on medications (-) angina(-) Past MI and (-) CHF  Rhythm:Regular     Neuro/Psych negative neurological ROS  negative psych ROS   GI/Hepatic negative GI ROS, Neg liver ROS,   Endo/Other  negative endocrine ROS  Renal/GU negative Renal ROS     Musculoskeletal   Abdominal   Peds  Hematology negative hematology ROS (+)   Anesthesia Other Findings   Reproductive/Obstetrics                            Anesthesia Physical Anesthesia Plan  ASA: II  Anesthesia Plan: General   Post-op Pain Management:    Induction: Intravenous  Airway Management Planned: Oral ETT  Additional Equipment: None  Intra-op Plan:   Post-operative Plan: Extubation in OR  Informed Consent: I have reviewed the patients History and Physical, chart, labs and discussed the procedure including the risks, benefits and alternatives for the proposed anesthesia with the patient or authorized representative who has indicated his/her understanding and acceptance.   Dental advisory given  Plan Discussed with: CRNA and Surgeon  Anesthesia Plan Comments:         Anesthesia Quick Evaluation

## 2015-08-07 NOTE — Op Note (Signed)
Preoperative diagnosis: Giant retinal tear with temporal retinal detachment left eye  postoperative diagnosis: Same  Procedure: Repair complex retinal detachment via vitrectomy, injection temporary Perfluoron, air-fluid exchange, endolaser retinopexy, membrane peel left eye  Surgeon Hurman Horn M.D.  General endotracheal anesthesia  Indication for procedure: Patient has 4 days onset of vision loss with [place] numerous vitreous floaters debris and of visual field loss in the left eye off to the nasal quadrant. Patient is found to have giant retinal tear extending from the 12:30 to 1:00 position temporally down to the 5:30 to 6:00 position. The posterior flap as folded posteriorly and a injuring of vision loss progressive retinal detachment.  Patient understands the risk of anesthesia including the rare occurrence death but also to the eye from underlying condition and the surgical para including but not limited to hemorrhage, infection, scarring, need for another surgery, no change of vision, loss of vision and progression of disease despite intervention. Signed consent was obtained patient taken to the operating room.  Description of procedure in detail in the operating room general anesthesia instituted without difficulty. [Region sterilely prepped. Surgical timeout carried out with staff and surgeon thereafter sterile prep and drape completed. Prior to the first incision second surgical timeout was completed. This time 25-gauge trocar placed in the inferotemporal quadrant placement verified visually infusion turned on. Spear trochars applied. Cortectomy was begun. Care is taken to avoid the native lens. Of the anterior leaflet to the giant retinal tear temporally was identified its vitreous attachments anteriorly were amputated the vitreous anterior lead in the anterior leaflet retina were amputated. The posterior leaflet with minimal manipulation. To be folded posteriorly. At this time under the fluid  phase I injected temporary Perfluoron overlying the macular region to prevent extension of the retinal tear of sterilely or peripherally. The Perfluoron was used to protect of the macula and the periphery. At this time completion of the of vitreous retina attachments  360 vitreous base was shaved.  With Perfluoron in place, I then began fluid air exchange. The air exchange was successfully remove the subretinal fluid and the classic sign of the clearance of all preretinal aqueous phase was was visualized to to confirm that the sent saline solution had been removed and only Perfluoron remained. The the peripheral leaflets of the giant tear flap flattened nicely to the choroid. He accumulated subretinal fluid was aspirated numerous occasions. At this juncture endolaser photo regulation was in place posterior to this leaflet and 3 rows and also to straddle the vitreous base 360. No comp cases occurred. Reaccumulated preretinal fluid was aspirated. The leaflet remained nicely flattened. At this time the Perfluoron was aspirated from the retina. Active aspiration was in carried out overlying the nerve without touching the nerve so as to vaporize all other remnants of Perfluoron. At this time and a air-C3F8 8% exchange was completed. The superior trochars removed from the eye. The infuses and removed and and the wound the intraocular pressure assessment be adequate. Subconjunctival Decadron applied. Sterile patch function applied. Patient tolerated procedure competition taken to the PACU in good and stable  condition hemodynamically. There were no complications.

## 2015-08-07 NOTE — Brief Op Note (Signed)
Preoperative diagnosis: Giant retinal tear with temporal retinal detachment left eye  postoperative diagnosis: Same  Procedure: Repair complex retinal detachment via vitrectomy, injection temporary Perfluoron, air-fluid exchange, endolaser retinopexy, membrane peel left eye  Surgeon Hurman Horn M.D.  General endotracheal anesthesia

## 2015-08-07 NOTE — Transfer of Care (Signed)
Immediate Anesthesia Transfer of Care Note  Patient: Harry Padilla  Procedure(s) Performed: Procedure(s): PARS PLANA VITRECTOMY WITH 25 GAUGE  (Left) INSERTION OF GAS-C3F8 (Left) PHOTOCOAGULATION WITH LASER (Left)  Patient Location: PACU  Anesthesia Type:General  Level of Consciousness: awake, alert  and oriented  Airway & Oxygen Therapy: Patient Spontanous Breathing and Patient connected to nasal cannula oxygen  Post-op Assessment: Report given to RN and Post -op Vital signs reviewed and stable  Post vital signs: Reviewed and stable  Last Vitals:  Filed Vitals:   08/07/15 1825 08/07/15 1827  BP:  127/66  Pulse:  69  Temp: 36.4 C   Resp:  12    Complications: No apparent anesthesia complications

## 2015-08-07 NOTE — Anesthesia Postprocedure Evaluation (Signed)
Anesthesia Post Note  Patient: Harry Padilla  Procedure(s) Performed: Procedure(s) (LRB): PARS PLANA VITRECTOMY WITH 25 GAUGE  (Left) INSERTION OF GAS-C3F8 (Left) PHOTOCOAGULATION WITH LASER-ENDOLASER (Left)  Patient location during evaluation: PACU Anesthesia Type: General Level of consciousness: awake Pain management: pain level controlled Vital Signs Assessment: post-procedure vital signs reviewed and stable Respiratory status: spontaneous breathing Cardiovascular status: stable Postop Assessment: No signs of nausea or vomiting Anesthetic complications: no    Last Vitals:  Filed Vitals:   08/07/15 1911 08/07/15 1920  BP: 119/67   Pulse: 68   Temp:  36.7 C  Resp: 10     Last Pain: There were no vitals filed for this visit.               Johannah Rozas

## 2015-08-07 NOTE — H&P (Signed)
59 year old man with several day history of floaters in the left eye [place] loss in left eye and vision loss none left eye found to have large retinal tear left eye retinal detachment by physicians of of [facility], and [place]. Patient's past medical history is notable for Lasix of the eyes some 15 years ago. He also has a history of hypertension middle also mass lobe the right lung and status post lobectomy of the lung.  Cycle history is otherwise noncontributory. No history of trauma. No other history of intraocular surgery.  No history of trauma.  His visual acuity right eye 25 left eye 20/50 pinhole 20/30. Pertinent findings on examination include mild to close chronic cataract in each eye. Funduscopic examination of the left eye discloses evidence of a giant retinal tear exiting 1:00 6:00 temporally in the right eye with posterior folding end of the temporal leaf of retina. Subretinal fluid is present. Infirmed on B-scan ultrasonography of the left eye today. Neck and vitreous opacification is also notable.  Impression #1 retinal detachment left eye with giant retinal tear.  #2 vitreous opacification left eye #3 bilateral mild  cataract of each eye.  Plan #1 surgical repair of left eye under general anesthesia, hospital via vitrectomy, laser, use of Perfluoron temporary, injection of gas and or silicone oil left eye.  These findings and considerations were reviewed and detail with the patient preoperatively.

## 2015-08-07 NOTE — Progress Notes (Signed)
Patient ID: AYVIN LIPINSKI, male   DOB: 02/04/1956, 59 y.o.   MRN: [id number] Patient: Harry Padilla, Male    DOB: July 02, 1956, 59 y.o.   MRN: [id number] Visit Date: 08/07/2015  Today's Provider: Wilhemena Durie, MD   Chief Complaint  Patient presents with  . Annual Exam   Subjective:  Harry Padilla is a 59 y.o. male who presents today for health maintenance and complete physical. He feels well. He reports exercising none. He reports he is sleeping fairly well.   Review of Systems  Constitutional: Negative for fever, chills, diaphoresis, activity change, appetite change, fatigue and unexpected weight change.  HENT: Negative for congestion, dental problem, drooling, ear discharge, ear pain, facial swelling, hearing loss, mouth sores, nosebleeds, postnasal drip, rhinorrhea, sinus pressure, sneezing, sore throat, tinnitus, trouble swallowing and voice change.   Eyes: Positive for visual disturbance. Negative for photophobia, pain, discharge, redness and itching.  Respiratory: Positive for apnea. Negative for cough, choking, chest tightness, shortness of breath, wheezing and stridor.   Cardiovascular: Negative for chest pain, palpitations and leg swelling.  Gastrointestinal: Negative for nausea, vomiting, abdominal pain, diarrhea, constipation, blood in stool, abdominal distention, anal bleeding and rectal pain.  Endocrine: Negative for cold intolerance, heat intolerance, polydipsia, polyphagia and polyuria.  Genitourinary: Negative for dysuria, urgency, frequency, hematuria, flank pain, decreased urine volume, discharge, penile swelling, scrotal swelling, enuresis, difficulty urinating, genital sores, penile pain and testicular pain.  Musculoskeletal: Negative for myalgias, back pain, joint swelling, arthralgias, gait problem, neck pain and neck stiffness.  Skin: Negative for color change, pallor, rash and wound.  Allergic/Immunologic: Negative for environmental allergies, food  allergies and immunocompromised state.  Neurological: Negative for dizziness, tremors, seizures, syncope, facial asymmetry, speech difficulty, weakness, light-headedness, numbness and headaches.  Hematological: Negative for adenopathy. Does not bruise/bleed easily.  Psychiatric/Behavioral: Negative for suicidal ideas, hallucinations, behavioral problems, confusion, sleep disturbance, self-injury, dysphoric mood, decreased concentration and agitation. The patient is not nervous/anxious and is not hyperactive.     Social History   Social History  . Marital Status: Married    Spouse Name: N/A  . Number of Children: 2  . Years of Education: N/A   Occupational History  . [employer]   . 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 Smoking status: Never Smoker   . Smokeless tobacco: Never Used  . Alcohol Use: No  . Drug Use: No  . Sexual Activity: Not on file   Other Topics Concern  . Not on file   Social History Narrative    Patient Active Problem List   Diagnosis Date Noted  . Carcinoid tumor 11/08/2013  . S/P lobectomy of lung 11/08/2013  . Mass of middle lobe of right lung 01/13/2012  . HBP (high blood pressure) 01/13/2012    Past Surgical History  Procedure Laterality Date  . Lasik    . Video bronchoscopy  01/26/2012    Procedure: VIDEO BRONCHOSCOPY WITHOUT FLUORO;  Surgeon: Tanda Rockers, MD;  Location: [facility];  Service: Cardiopulmonary;  Laterality: Bilateral;  . Video bronchoscopy  02/04/2012    Procedure: VIDEO BRONCHOSCOPY;  Surgeon: Gaye Pollack, MD;  Location: [facility] OR;  Service: Thoracic;  Laterality: N/A;  . Lung cancer surgery      His family history includes Heart disease in his mother.    Outpatient Prescriptions Prior to Visit  Medication Sig Dispense Refill  . amLODipine (NORVASC) 5 MG tablet Take 5 mg  by mouth daily.    . cholecalciferol (VITAMIN D) 400 UNITS TABS Take 400 Units by mouth daily.    .  Loratadine 10 MG CAPS Take 1 capsule by mouth at bedtime as needed. For allergies    . losartan (COZAAR) 100 MG tablet Take 100 mg by mouth daily.    . Multiple Vitamins-Minerals (MENS MULTIPLUS PO) Take 1 tablet by mouth daily.    . vitamin C (ASCORBIC ACID) 250 MG tablet Take 250 mg by mouth daily.    . vitamin E 400 UNIT capsule Take 400 Units by mouth daily.     No facility-administered medications prior to visit.    Patient Care Team: Jerrol Banana., MD as PCP - General (Unknown Physician Specialty)     Objective:   Vitals:  Filed Vitals:   08/07/15 1003  BP: 142/84  Pulse: 60  Temp: 98.5 F (36.9 C)  TempSrc: Oral  Resp: 16  Height: 5' 7.5" (1.715 m)  Weight: 210 lb (95.255 kg)    Physical Exam  Constitutional: He is oriented to person, place, and time. He appears well-developed and well-nourished.  HENT:  Head: Normocephalic and atraumatic.  Right Ear: External ear normal.  Left Ear: External ear normal.  Nose: Nose normal.  Mouth/Throat: Oropharynx is clear and moist.  Eyes: Conjunctivae and EOM are normal. Pupils are equal, round, and reactive to light.  Neck: Normal range of motion. Neck supple. Thyromegaly present.  Small right thyroid nodule.  Cardiovascular: Normal rate, regular rhythm, normal heart sounds and intact distal pulses.   Pulmonary/Chest: Effort normal and breath sounds normal.  Abdominal: Soft. Bowel sounds are normal.  Genitourinary: Rectum normal, prostate normal and penis normal.  Musculoskeletal: Normal range of motion.  Neurological: He is alert and oriented to person, place, and time.  Skin: Skin is warm and dry.  Psychiatric: He has a normal mood and affect. His behavior is normal. Judgment and thought content normal.     Depression Screen No flowsheet data found.    Assessment & Plan:     Routine Health Maintenance and Physical Exam  Exercise Activities and Dietary recommendations Goals    None       There is  no immunization history on file for this patient.  Health Maintenance  Topic Date Due  . Hepatitis C Screening  August 03, 1956  . HIV Screening  01/04/1969  . TETANUS/TDAP  01/04/1973  . COLONOSCOPY  01/05/2004  . ZOSTAVAX  01/04/2014  . INFLUENZA VACCINE  04/15/2015      Discussed health benefits of physical activity, and encouraged him to engage in regular exercise appropriate for his age and condition.   New visual Changes/possible retinal detachment  Ophthalmology to see pt now. New Thyroid nodule Refer to Endocrine.  I have done the exam and reviewed the above chart and it is accurate to the best of my knowledge.  ------------------------------------------------------------------------------------------------------------

## 2015-08-07 NOTE — Anesthesia Procedure Notes (Signed)
Procedure Name: Intubation Date/Time: 08/07/2015 4:54 PM Performed by: Eligha Bridegroom Pre-anesthesia Checklist: Emergency Drugs available, Timeout performed, Suction available, Patient identified and Patient being monitored Patient Re-evaluated:Patient Re-evaluated prior to inductionOxygen Delivery Method: Circle system utilized Preoxygenation: Pre-oxygenation with 100% oxygen Intubation Type: IV induction Ventilation: Mask ventilation without difficulty and Oral airway inserted - appropriate to patient size Laryngoscope Size: Mac and 4 Grade View: Grade III Tube size: 7.5 mm Airway Equipment and Method: Stylet and LTA kit utilized Placement Confirmation: ETT inserted through vocal cords under direct vision,  breath sounds checked- equal and bilateral and positive ETCO2 Secured at: 22 cm Tube secured with: Tape Dental Injury: Teeth and Oropharynx as per pre-operative assessment

## 2015-08-08 DIAGNOSIS — H33032 Retinal detachment with giant retinal tear, left eye: Secondary | ICD-10-CM | POA: Diagnosis not present

## 2015-08-08 NOTE — Progress Notes (Signed)
Pt discharged to home.  Discharge instructions explained to pt.  Pt has no questions at the time of discharge.  Pt states he has all belongings.  IV removed.  Pt ambulated off unit on his own.

## 2015-08-08 NOTE — Progress Notes (Signed)
Patient does not want to use the CPAP due to recent eye surgery and the possibility of the pressure generated by the CPAP to blow to his eye. He will use it tonight 08/09/2015. CPAP equipment is in the room. Auto Mode 20/6 and nasal mask.Marland Kitchen

## 2015-08-12 ENCOUNTER — Encounter (HOSPITAL_COMMUNITY): Payer: Self-pay | Admitting: Ophthalmology

## 2015-08-15 ENCOUNTER — Other Ambulatory Visit: Payer: Self-pay | Admitting: Family Medicine

## 2015-10-11 ENCOUNTER — Encounter: Payer: Self-pay | Admitting: Family Medicine

## 2015-11-01 ENCOUNTER — Other Ambulatory Visit: Payer: Self-pay | Admitting: Surgery

## 2015-11-01 DIAGNOSIS — D3A00 Benign carcinoid tumor of unspecified site: Secondary | ICD-10-CM

## 2015-11-06 ENCOUNTER — Ambulatory Visit (INDEPENDENT_AMBULATORY_CARE_PROVIDER_SITE_OTHER): Payer: BLUE CROSS/BLUE SHIELD | Admitting: Surgery

## 2015-11-06 ENCOUNTER — Encounter: Payer: Self-pay | Admitting: Surgery

## 2015-11-06 ENCOUNTER — Ambulatory Visit
Admission: RE | Admit: 2015-11-06 | Discharge: 2015-11-06 | Disposition: A | Discharge status: Home / Self Care | Payer: Self-pay | Source: Ambulatory Visit | Attending: Surgery | Admitting: Surgery

## 2015-11-06 VITALS — BP 144/91 | HR 74 | Resp 16 | Ht 67.5 in | Wt 212.0 lb

## 2015-11-06 DIAGNOSIS — D3A00 Benign carcinoid tumor of unspecified site: Secondary | ICD-10-CM

## 2015-11-06 DIAGNOSIS — Z902 Acquired absence of lung [part of]: Secondary | ICD-10-CM | POA: Diagnosis not present

## 2015-11-06 NOTE — Progress Notes (Signed)
     HPI:  He returns today for followup status post right thoracotomy and right middle lobectomy on 02/04/2012 for a T1A, N0 low-grade neuroendocrine tumor. Surgical margins were negative. Since I last saw him he has had treatment for a complex retinal detachment in the left eye but has made a good recovery. He denies any cough or sputum production. He denies hemoptysis. He had no shortness of breath. He denies any headaches. He still has some floaters in the left eye after his retina surgery and a cataract.  Current Outpatient Prescriptions  Medication Sig Dispense Refill  . amLODipine (NORVASC) 5 MG tablet TAKE 1 TABLET BY MOUTH EVERY DAY 30 tablet 11  . aspirin EC 81 MG tablet Take 81 mg by mouth at bedtime.    . cholecalciferol (VITAMIN D) 400 UNITS TABS Take 400 Units by mouth at bedtime.     Marland Kitchen loratadine (CLARITIN) 10 MG tablet Take 10 mg by mouth at bedtime as needed (seasonal allergies).    . losartan (COZAAR) 100 MG tablet TAKE 1 TABLET BY MOUTH EVERY DAY 30 tablet 11  . Multiple Vitamin (MULTIVITAMIN WITH MINERALS) TABS tablet Take 1 tablet by mouth daily.    Marland Kitchen ofloxacin (OCUFLOX) 0.3 % ophthalmic solution   0  . PRESCRIPTION MEDICATION at bedtime. CPAP    . vitamin C (ASCORBIC ACID) 250 MG tablet Take 250 mg by mouth at bedtime.     . vitamin E 400 UNIT capsule Take 400 Units by mouth at bedtime.      No current facility-administered medications for this visit.     Physical Exam:  BP 144/91 mmHg  Pulse 74  Resp 16  Ht 5' 7.5" (1.715 m)  Wt 212 lb (96.163 kg)  BMI 32.69 kg/m2  SpO2 97% He looks well.  There is no cervical or supraclavicular adenopathy.  The right chest incision is well-healed. There are no skin lesions or subcutaneous nodules.  Lung exam is clear.  Cardiac exam shows a regular rate and rhythm with normal heart sounds.   Diagnostic Tests:  CLINICAL DATA: 60 year old male with treated thoracic carcinoid tumor. Subsequent  encounter.  EXAM: CHEST 2 VIEW  COMPARISON: Low-dose screening chest CT 05/08/2015. Chest radiographs 11/07/2014 and earlier.  FINDINGS: Stable postoperative changes about the right hilum and reduced right lung volume. Stable cardiac size and mediastinal contours. No pneumothorax, pulmonary edema, pleural effusion or acute pulmonary opacity. Visualized tracheal air column is within normal limits. Chronic right lateral rib deformities. No acute osseous abnormality identified.  IMPRESSION: Stable and satisfactory postoperative appearance of the chest.   Electronically Signed  By: Genevie Ann M.D.  On: 11/06/2015 09:11   Impression:  He is doing well with no sign of recurrent carcinoid tumor. The subpleural RUL ground-glass nodular density was unchanged on his CT scan of the chest last August dating back to 09/2012 and is likely benign but should be followed.  Plan:  I will see him back in 6 months with a CT of the chest. If the ground-glass nodule is unchanged then I think he can be followed with a periodic CXR.    Gaye Pollack, MD [facility] [phone]

## 2015-11-15 ENCOUNTER — Encounter: Payer: Self-pay | Admitting: *Deleted

## 2015-11-22 NOTE — Discharge Instructions (Signed)

## 2015-11-25 ENCOUNTER — Encounter
Admission: RE | Disposition: A | Discharge status: Home / Self Care | Payer: Self-pay | Source: Ambulatory Visit | Attending: Ophthalmology

## 2015-11-25 ENCOUNTER — Ambulatory Visit: Payer: BLUE CROSS/BLUE SHIELD | Admitting: Anesthesiology

## 2015-11-25 ENCOUNTER — Ambulatory Visit
Admission: RE | Admit: 2015-11-25 | Discharge: 2015-11-25 | Disposition: A | Discharge status: Home / Self Care | Payer: BLUE CROSS/BLUE SHIELD | Source: Ambulatory Visit | Attending: Ophthalmology | Admitting: Ophthalmology

## 2015-11-25 DIAGNOSIS — Z880 Allergy status to penicillin: Secondary | ICD-10-CM | POA: Insufficient documentation

## 2015-11-25 DIAGNOSIS — H2512 Age-related nuclear cataract, left eye: Secondary | ICD-10-CM | POA: Diagnosis not present

## 2015-11-25 DIAGNOSIS — G4730 Sleep apnea, unspecified: Secondary | ICD-10-CM | POA: Diagnosis not present

## 2015-11-25 DIAGNOSIS — I10 Essential (primary) hypertension: Secondary | ICD-10-CM | POA: Insufficient documentation

## 2015-11-25 DIAGNOSIS — Z85118 Personal history of other malignant neoplasm of bronchus and lung: Secondary | ICD-10-CM | POA: Diagnosis not present

## 2015-11-25 HISTORY — PX: CATARACT EXTRACTION W/PHACO: SHX586

## 2015-11-25 SURGERY — PHACOEMULSIFICATION, CATARACT, WITH IOL INSERTION
Anesthesia: Monitor Anesthesia Care | Site: Eye | Laterality: Left | Wound class: Clean

## 2015-11-25 MED ORDER — EPINEPHRINE HCL 1 MG/ML IJ SOLN
INTRAMUSCULAR | Status: DC | PRN
  Administered 2015-11-25: 86 mL via OPHTHALMIC

## 2015-11-25 MED ORDER — POVIDONE-IODINE 5 % OP SOLN
1.0000 "application " | OPHTHALMIC | Status: DC | PRN
  Administered 2015-11-25: 1 via OPHTHALMIC

## 2015-11-25 MED ORDER — ARMC OPHTHALMIC DILATING GEL
1.0000 "application " | OPHTHALMIC | Status: DC | PRN
  Administered 2015-11-25 (×2): 1 via OPHTHALMIC

## 2015-11-25 MED ORDER — MOXIFLOXACIN HCL 0.5 % OP SOLN
OPHTHALMIC | Status: DC | PRN
  Administered 2015-11-25: .2 mL via OPHTHALMIC

## 2015-11-25 MED ORDER — FENTANYL CITRATE (PF) 100 MCG/2ML IJ SOLN
INTRAMUSCULAR | Status: DC | PRN
  Administered 2015-11-25: 100 ug via INTRAVENOUS

## 2015-11-25 MED ORDER — ACETAMINOPHEN 325 MG PO TABS: 325.0000 mg | ORAL_TABLET | ORAL | Status: DC | PRN

## 2015-11-25 MED ORDER — LIDOCAINE HCL (PF) 4 % IJ SOLN
INTRAOCULAR | Status: DC | PRN
  Administered 2015-11-25: 4 mL via OPHTHALMIC

## 2015-11-25 MED ORDER — TETRACAINE HCL 0.5 % OP SOLN
1.0000 [drp] | OPHTHALMIC | Status: DC | PRN
  Administered 2015-11-25: 1 [drp] via OPHTHALMIC

## 2015-11-25 MED ORDER — BRIMONIDINE TARTRATE 0.2 % OP SOLN
OPHTHALMIC | Status: DC | PRN
  Administered 2015-11-25: 1 [drp] via OPHTHALMIC

## 2015-11-25 MED ORDER — MIDAZOLAM HCL 2 MG/2ML IJ SOLN
INTRAMUSCULAR | Status: DC | PRN
  Administered 2015-11-25: 2 mg via INTRAVENOUS

## 2015-11-25 MED ORDER — NA HYALUR & NA CHOND-NA HYALUR 0.4-0.35 ML IO KIT
PACK | INTRAOCULAR | Status: DC | PRN
Start: 2015-11-25 — End: 2015-11-25
  Administered 2015-11-25: 1 mL via INTRAOCULAR

## 2015-11-25 MED ORDER — TIMOLOL MALEATE 0.5 % OP SOLN
OPHTHALMIC | Status: DC | PRN
Start: 2015-11-25 — End: 2015-11-25
  Administered 2015-11-25: 1 [drp] via OPHTHALMIC

## 2015-11-25 MED ORDER — ONDANSETRON HCL 4 MG/2ML IJ SOLN: 4.0000 mg | Freq: Once | INTRAMUSCULAR | Status: DC | PRN

## 2015-11-25 MED ORDER — MOXIFLOXACIN HCL 0.5 % OP SOLN: OPHTHALMIC | Status: DC | PRN

## 2015-11-25 MED ORDER — ACETAMINOPHEN 160 MG/5ML PO SOLN: 325.0000 mg | ORAL | Status: DC | PRN

## 2015-11-25 SURGICAL SUPPLY — 32 items
APL FBRTP 3 NS LF CTTN WD (MISCELLANEOUS) ×1
APPLICATOR COTTON TIP 3IN (MISCELLANEOUS) ×2 IMPLANT
CANNULA ANT/CHMB 27G (MISCELLANEOUS) ×1 IMPLANT
CANNULA ANT/CHMB 27GA (MISCELLANEOUS) ×2 IMPLANT
DISSECTOR HYDRO NUCLEUS 50X22 (MISCELLANEOUS) ×2 IMPLANT
GLOVE BIO SURGEON STRL SZ7 (GLOVE) ×2 IMPLANT
GLOVE SURG LX 6.5 MICRO (GLOVE) ×1
GLOVE SURG LX STRL 6.5 MICRO (GLOVE) ×1 IMPLANT
GOWN STRL REUS W/ TWL LRG LVL3 (GOWN DISPOSABLE) ×2 IMPLANT
GOWN STRL REUS W/TWL LRG LVL3 (GOWN DISPOSABLE) ×4
LENS IOL ACRSF IQ PC 23.5 (Intraocular Lens) IMPLANT
LENS IOL ACRYSOF IQ POST 23.5 (Intraocular Lens) ×2 IMPLANT
MARKER SKIN DUAL TIP RULER LAB (MISCELLANEOUS) ×2 IMPLANT
NDL FILTER BLUNT 18X1 1/2 (NEEDLE) ×1 IMPLANT
NEEDLE FILTER BLUNT 18X 1/2SAF (NEEDLE) ×1
NEEDLE FILTER BLUNT 18X1 1/2 (NEEDLE) ×1 IMPLANT
PACK CATARACT BRASINGTON (MISCELLANEOUS) ×2 IMPLANT
PACK EYE AFTER SURG (MISCELLANEOUS) ×2 IMPLANT
PACK OPTHALMIC (MISCELLANEOUS) ×2 IMPLANT
RING MALYGIN 7.0 (MISCELLANEOUS) IMPLANT
SOL BAL SALT 15ML (MISCELLANEOUS)
SOLUTION BAL SALT 15ML (MISCELLANEOUS) IMPLANT
SUT ETHILON 10-0 CS-B-6CS-B-6 (SUTURE)
SUT VICRYL  9 0 (SUTURE)
SUT VICRYL 9 0 (SUTURE) IMPLANT
SUTURE EHLN 10-0 CS-B-6CS-B-6 (SUTURE) IMPLANT
SYR 3ML LL SCALE MARK (SYRINGE) ×2 IMPLANT
SYR TB 1ML LUER SLIP (SYRINGE) ×2 IMPLANT
WATER STERILE IRR 250ML POUR (IV SOLUTION) ×2 IMPLANT
WATER STERILE IRR 500ML POUR (IV SOLUTION) IMPLANT
WICK EYE OCUCEL (MISCELLANEOUS) IMPLANT
WIPE NON LINTING 3.25X3.25 (MISCELLANEOUS) ×2 IMPLANT

## 2015-11-25 NOTE — Transfer of Care (Signed)
Immediate Anesthesia Transfer of Care Note  Patient: Harry Padilla  Procedure(s) Performed: Procedure(s) with comments: CATARACT EXTRACTION PHACO AND INTRAOCULAR LENS PLACEMENT (IOC) (Left) - CPAP  Patient Location: PACU  Anesthesia Type: MAC  Level of Consciousness: awake, alert  and patient cooperative  Airway and Oxygen Therapy: Patient Spontanous Breathing and Patient connected to supplemental oxygen  Post-op Assessment: Post-op Vital signs reviewed, Patient's Cardiovascular Status Stable, Respiratory Function Stable, Patent Airway and No signs of Nausea or vomiting  Post-op Vital Signs: Reviewed and stable  Complications: No apparent anesthesia complications

## 2015-11-25 NOTE — Op Note (Signed)
Date of Surgery: 11/25/2015  PREOPERATIVE DIAGNOSES: Visually significant posterior subcapsular cataract, left eye.  POSTOPERATIVE DIAGNOSES: Same  PROCEDURES PERFORMED: Cataract extraction with intraocular lens implant, left eye.  SURGEON: Almon Hercules, M.D.  ANESTHESIA: MAC and topical  IMPLANTS: AcrySof IQ [id number] +23.5 D  Implant Name Type Inv. Item Serial No. Manufacturer Lot No. LRB No. Used  IMPLANT LENS - [id number] Intraocular Lens IMPLANT LENS [id number] ALCON   Left 1    COMPLICATIONS: None.  DESCRIPTION OF PROCEDURE: Therapeutic options were discussed with the patient preoperatively, including a discussion of risks and benefits of surgery. Informed consent was obtained. An IOL-Master and immersion biometry were used to take the lens measurements, and a dilated fundus exam was performed within 3 months of the surgical date.  The patient was premedicated and brought to the operating room and placed on the operating table in the supine position. After adequate anesthesia, the patient was prepped and draped in the usual sterile ophthalmic fashion. A wire lid speculum was inserted and the microscope was positioned. A Superblade was used to create a paracentesis site at the limbus and a small amount of dilute preservative free lidocaine was instilled into the anterior chamber, followed by dispersive viscoelastic. A clear corneal incision was created temporally using a 2.4 mm keratome blade. Capsulorrhexis was then performed. In situ phacoemulsification was performed.  Cortical material was removed with the irrigation-aspiration unit. Dispersive viscoelastic was instilled to open the capsular bag. A posterior chamber intraocular lens with the specifications above was inserted and positioned. Irrigation-aspiration was used to remove all viscoelastic. Vigamox 1cc was instilled into the anterior chamber, and the corneal incision was checked and found to be water tight. The eyelid speculum  was removed.  The operative eye was covered with protective goggles after instilling 1 drop of timolol and brimonidine. The patient tolerated the procedure well. There were no complications.

## 2015-11-25 NOTE — Anesthesia Procedure Notes (Signed)
Procedure Name: MAC Performed by: Darald Uzzle Pre-anesthesia Checklist: Patient identified, Emergency Drugs available, Suction available, Timeout performed and Patient being monitored Patient Re-evaluated:Patient Re-evaluated prior to inductionOxygen Delivery Method: Nasal cannula Placement Confirmation: positive ETCO2     

## 2015-11-25 NOTE — H&P (Signed)
H+P reviewed and is up to date, please see paper chart.  

## 2015-11-25 NOTE — Anesthesia Preprocedure Evaluation (Signed)
Anesthesia Evaluation  Patient identified by MRN, date of birth, ID band Patient awake    Reviewed: Allergy & Precautions, NPO status , Patient's Chart, lab work & pertinent test results  History of Anesthesia Complications Negative for: history of anesthetic complications  Airway Mallampati: III  TM Distance: >3 FB Neck ROM: Full    Dental  (+) Teeth Intact   Pulmonary sleep apnea and Continuous Positive Airway Pressure Ventilation ,    breath sounds clear to auscultation       Cardiovascular hypertension, Pt. on medications (-) angina(-) Past MI and (-) CHF  Rhythm:Regular     Neuro/Psych negative neurological ROS  negative psych ROS   GI/Hepatic negative GI ROS, Neg liver ROS,   Endo/Other  negative endocrine ROS  Renal/GU negative Renal ROS     Musculoskeletal   Abdominal   Peds  Hematology negative hematology ROS (+)   Anesthesia Other Findings   Reproductive/Obstetrics                             Anesthesia Physical  Anesthesia Plan  ASA: II  Anesthesia Plan: MAC   Post-op Pain Management:    Induction: Intravenous  Airway Management Planned: Nasal Cannula  Additional Equipment: None  Intra-op Plan:   Post-operative Plan:   Informed Consent: I have reviewed the patients History and Physical, chart, labs and discussed the procedure including the risks, benefits and alternatives for the proposed anesthesia with the patient or authorized representative who has indicated his/her understanding and acceptance.     Plan Discussed with: CRNA  Anesthesia Plan Comments:         Anesthesia Quick Evaluation

## 2015-11-25 NOTE — Anesthesia Postprocedure Evaluation (Signed)
Anesthesia Post Note  Patient: Harry Padilla  Procedure(s) Performed: Procedure(s) (LRB): CATARACT EXTRACTION PHACO AND INTRAOCULAR LENS PLACEMENT (IOC) (Left)  Patient location during evaluation: PACU Anesthesia Type: MAC Level of consciousness: awake and alert Pain management: pain level controlled Vital Signs Assessment: post-procedure vital signs reviewed and stable Respiratory status: spontaneous breathing, nonlabored ventilation, respiratory function stable and patient connected to nasal cannula oxygen Cardiovascular status: stable and blood pressure returned to baseline Anesthetic complications: no    Amaryllis Dyke

## 2015-11-26 ENCOUNTER — Encounter: Payer: Self-pay | Admitting: Ophthalmology

## 2016-02-05 ENCOUNTER — Ambulatory Visit: Payer: BLUE CROSS/BLUE SHIELD | Admitting: Family Medicine

## 2016-02-17 ENCOUNTER — Ambulatory Visit: Payer: BLUE CROSS/BLUE SHIELD | Admitting: Family Medicine

## 2016-03-26 ENCOUNTER — Ambulatory Visit: Payer: BLUE CROSS/BLUE SHIELD | Admitting: Family Medicine

## 2016-03-26 ENCOUNTER — Other Ambulatory Visit: Payer: Self-pay | Admitting: *Deleted

## 2016-03-26 DIAGNOSIS — R911 Solitary pulmonary nodule: Secondary | ICD-10-CM

## 2016-04-27 ENCOUNTER — Ambulatory Visit: Payer: BLUE CROSS/BLUE SHIELD | Admitting: Family Medicine

## 2016-04-28 DIAGNOSIS — H2511 Age-related nuclear cataract, right eye: Secondary | ICD-10-CM | POA: Diagnosis not present

## 2016-04-28 DIAGNOSIS — T85318A Breakdown (mechanical) of other ocular prosthetic devices, implants and grafts, initial encounter: Secondary | ICD-10-CM | POA: Diagnosis not present

## 2016-04-28 DIAGNOSIS — H33032 Retinal detachment with giant retinal tear, left eye: Secondary | ICD-10-CM | POA: Diagnosis not present

## 2016-04-29 ENCOUNTER — Ambulatory Visit (INDEPENDENT_AMBULATORY_CARE_PROVIDER_SITE_OTHER): Payer: BLUE CROSS/BLUE SHIELD | Admitting: Family Medicine

## 2016-04-29 ENCOUNTER — Encounter: Payer: Self-pay | Admitting: Family Medicine

## 2016-04-29 VITALS — BP 134/80 | HR 72 | Temp 98.9°F | Resp 16 | Ht 68.0 in | Wt 226.0 lb

## 2016-04-29 DIAGNOSIS — I10 Essential (primary) hypertension: Secondary | ICD-10-CM | POA: Diagnosis not present

## 2016-04-29 DIAGNOSIS — M779 Enthesopathy, unspecified: Secondary | ICD-10-CM | POA: Diagnosis not present

## 2016-04-29 MED ORDER — NAPROXEN 500 MG PO TABS: 500.0000 mg | ORAL_TABLET | Freq: Two times a day (BID) | ORAL | 5 refills | Status: DC

## 2016-04-29 NOTE — Progress Notes (Signed)
Patient: Harry Padilla Male    DOB: 1956/04/24   60 y.o.   MRN: [id number] Visit Date: 04/29/2016  Today's Provider: Wilhemena Durie, MD   Chief Complaint  Patient presents with  . Hypertension   Subjective:    HPI  Hypertension, follow-up:  BP Readings from Last 3 Encounters:  04/29/16 134/80  11/25/15 138/88  11/06/15 (!) 144/91    He was last seen for hypertension 6 months ago.  BP at that visit was 138/88.  Management since that visit includes no changes. He reports good compliance with treatment. He is not having side effects.  He is not exercising. He is not adherent to low salt diet.   Outside blood pressures are checked occasionally. Patient denies chest pain, dyspnea, exertional chest pressure/discomfort and tachypnea.      Weight trend: stable Wt Readings from Last 3 Encounters:  04/29/16 226 lb (102.5 kg)  11/25/15 217 lb (98.4 kg)  11/06/15 212 lb (96.2 kg)    Current diet: well balanced        Allergies  Allergen Reactions  . Penicillins Rash     As child   Current Meds  Medication Sig  . amLODipine (NORVASC) 5 MG tablet TAKE 1 TABLET BY MOUTH EVERY DAY  . aspirin EC 81 MG tablet Take 81 mg by mouth at bedtime.  Marland Kitchen loratadine (CLARITIN) 10 MG tablet Take 10 mg by mouth at bedtime as needed (seasonal allergies).  . losartan (COZAAR) 100 MG tablet TAKE 1 TABLET BY MOUTH EVERY DAY  . Multiple Vitamin (MULTIVITAMIN WITH MINERALS) TABS tablet Take 1 tablet by mouth daily.  . niacin 500 MG tablet Take 500 mg by mouth at bedtime.  Marland Kitchen ofloxacin (OCUFLOX) 0.3 % ophthalmic solution Reported on 11/25/2015  . PRESCRIPTION MEDICATION at bedtime. CPAP  . vitamin E 400 UNIT capsule Take 400 Units by mouth at bedtime.     Review of Systems  Constitutional: Negative.   Eyes: Positive for visual disturbance.       Gwyndolyn Saxon improving since last seen. Had retinal detachment and immediately saw ophthalmology that day. Has had surgery since then.   Respiratory: Negative.   Cardiovascular: Negative.   Endocrine: Negative.   Genitourinary: Negative.   Musculoskeletal: Positive for arthralgias.       Left knee pain   Skin: Negative.   Allergic/Immunologic: Negative.   Neurological: Negative.     Social History  Substance Use Topics  . Smoking status: Never Smoker  . Smokeless tobacco: Never Used  . Alcohol use No   Objective:   BP 134/80 (BP Location: Right Arm, Patient Position: Sitting, Cuff Size: Normal)   Pulse 72   Temp 98.9 F (37.2 C)   Resp 16   Ht '5\' 8"'$  (1.727 m)   Wt 226 lb (102.5 kg)   BMI 34.36 kg/m   Physical Exam  Constitutional: He is oriented to person, place, and time. He appears well-developed and well-nourished.  HENT:  Head: Normocephalic and atraumatic.  Right Ear: External ear normal.  Left Ear: External ear normal.  Nose: Nose normal.  Eyes: Conjunctivae are normal. No scleral icterus.  Neck: No thyromegaly present.  Cardiovascular: Normal rate, regular rhythm and normal heart sounds.   Pulmonary/Chest: Effort normal and breath sounds normal.  Musculoskeletal: Normal range of motion.  No effusion. No tenderness in left knee.   Lymphadenopathy:    He has no cervical adenopathy.  Neurological: He is alert and oriented to person, place,  and time.  Skin: Skin is warm and dry.  Psychiatric: He has a normal mood and affect. His behavior is normal. Judgment and thought content normal.        Assessment & Plan:     1. Essential hypertension   2. Tendonitis  - naproxen (NAPROSYN) 500 MG tablet; Take 1 tablet (500 mg total) by mouth 2 (two) times daily with a meal.  Dispense: 60 tablet; Refill: 5 3.Retinal detachment Vision 20/70 4,Lung Cancer Cured.      I have done the exam and reviewed the above chart and it is accurate to the best of my knowledge.  Richard Cranford Mon, MD  [facility]

## 2016-05-06 ENCOUNTER — Ambulatory Visit
Admission: RE | Admit: 2016-05-06 | Discharge: 2016-05-06 | Disposition: A | Discharge status: Home / Self Care | Payer: BLUE CROSS/BLUE SHIELD | Source: Ambulatory Visit | Attending: Surgery | Admitting: Surgery

## 2016-05-06 ENCOUNTER — Ambulatory Visit (INDEPENDENT_AMBULATORY_CARE_PROVIDER_SITE_OTHER): Payer: BLUE CROSS/BLUE SHIELD | Admitting: Surgery

## 2016-05-06 ENCOUNTER — Encounter: Payer: Self-pay | Admitting: Surgery

## 2016-05-06 VITALS — BP 137/85 | HR 75 | Resp 20 | Ht 68.0 in | Wt 226.0 lb

## 2016-05-06 DIAGNOSIS — R911 Solitary pulmonary nodule: Secondary | ICD-10-CM | POA: Diagnosis not present

## 2016-05-06 DIAGNOSIS — D3A00 Benign carcinoid tumor of unspecified site: Secondary | ICD-10-CM | POA: Diagnosis not present

## 2016-05-06 NOTE — Progress Notes (Signed)
HPI:  The patient returns today for follow up status post right thoracotomy and right middle lobectomy on 02/04/2012 for a T1A, N0 low-grade neuroendocrine tumor. He denies any cough or sputum production. He denies hemoptysis. He had no shortness of breath. He denies any headaches. He has occasional floaters in his left eye after surgery for a retinal detachment last year. He says that he has gained about 15-20 lbs over the past year from not being able to exercise after surgery for his complex retinal detachment.  Current Outpatient Prescriptions  Medication Sig Dispense Refill  . amLODipine (NORVASC) 5 MG tablet TAKE 1 TABLET BY MOUTH EVERY DAY 30 tablet 11  . aspirin EC 81 MG tablet Take 81 mg by mouth at bedtime.    Marland Kitchen loratadine (CLARITIN) 10 MG tablet Take 10 mg by mouth at bedtime as needed (seasonal allergies).    . losartan (COZAAR) 100 MG tablet TAKE 1 TABLET BY MOUTH EVERY DAY 30 tablet 11  . Multiple Vitamin (MULTIVITAMIN WITH MINERALS) TABS tablet Take 1 tablet by mouth daily.    . naproxen (NAPROSYN) 500 MG tablet Take 1 tablet (500 mg total) by mouth 2 (two) times daily with a meal. 60 tablet 5  . niacin 500 MG tablet Take 500 mg by mouth at bedtime.    Marland Kitchen ofloxacin (OCUFLOX) 0.3 % ophthalmic solution Reported on 11/25/2015  0  . PRESCRIPTION MEDICATION at bedtime. CPAP    . vitamin E 400 UNIT capsule Take 400 Units by mouth at bedtime.      No current facility-administered medications for this visit.      Physical Exam: BP 137/85 (BP Location: Right Arm, Patient Position: Sitting, Cuff Size: Normal)   Pulse 75   Resp 20   Ht '5\' 8"'$  (1.727 m)   Wt 226 lb (102.5 kg)   SpO2 98% Comment: RA  BMI 34.36 kg/m  He looks well.  There is no cervical or supraclavicular adenopathy.  The right chest incision is well-healed. There are no skin lesions or subcutaneous nodules.  Lung exam is clear.  Cardiac exam shows a regular rate and rhythm with normal heart sounds.    Diagnostic Tests:  CLINICAL DATA:  Followup right upper lobe nodule. History of right lung cancer with right middle lobectomy.  EXAM: CT CHEST WITHOUT CONTRAST  TECHNIQUE: Multidetector CT imaging of the chest was performed following the standard protocol without IV contrast.  COMPARISON:  CT 05/08/2015  FINDINGS: Cardiovascular: Scattered aortic calcifications. Moderate to advanced coronary artery calcifications. Heart is upper limits normal in size. Tortuosity of the thoracic aorta. No aneurysm.  Mediastinum/Nodes: No mediastinal, hilar, or axillary adenopathy.  Lungs/Pleura: Postsurgical changes from right middle lobectomy. Stable ground-glass subpleural nodule in the right upper lower lobe on image 43 measuring 4 mm on today's study. Scarring within the right mid and lower lung. No focal opacity on the left. No effusions.  Upper Abdomen: Imaging into the upper abdomen shows no acute findings.  Musculoskeletal: Chest wall soft tissues are unremarkable.  IMPRESSION: No acute bony abnormality or focal bone lesion.  Stable postsurgical changes and scarring in the right lung. Stable small subpleural right upper lobe ground-glass nodule.  No evidence of acute cardiopulmonary disease.  Coronary artery disease.  Aortic atherosclerosis.   Electronically Signed   By: Rolm Baptise M.D.   On: 05/06/2016 14:27  Impression:  The right upper lobe subpleural nodule has been stable since 01/1699 and is certainly benign. I don't think this requires  further follow up. He is 4 years out from his carcinoid tumor resection and I think he could be followed with a CXR from this point onward unless there is an abnormality on the CXR. He should have a yearly CXR and this can be done in Dr. Alben Spittle office during his regular health maintenance follow up or I will be glad to continue following this. The patient will discuss that with Dr. Rosanna Randy at his next  visit.  Plan:  He will need a yearly CXR starting one year from now and this will either be done by Dr. Rosanna Randy or I will plan to see him back to do this if Dr. Rosanna Randy desires.   Gaye Pollack, MD [facility] [phone]

## 2016-07-11 ENCOUNTER — Other Ambulatory Visit: Payer: Self-pay | Admitting: Family Medicine

## 2016-07-21 DIAGNOSIS — H269 Unspecified cataract: Secondary | ICD-10-CM | POA: Diagnosis not present

## 2016-08-26 ENCOUNTER — Encounter: Payer: Self-pay | Admitting: Family Medicine

## 2016-08-26 ENCOUNTER — Ambulatory Visit (INDEPENDENT_AMBULATORY_CARE_PROVIDER_SITE_OTHER): Payer: BLUE CROSS/BLUE SHIELD | Admitting: Family Medicine

## 2016-08-26 VITALS — BP 140/70 | HR 68 | Temp 97.7°F | Resp 16 | Ht 68.0 in | Wt 222.0 lb

## 2016-08-26 DIAGNOSIS — Z0000 Encounter for general adult medical examination without abnormal findings: Secondary | ICD-10-CM

## 2016-08-26 DIAGNOSIS — Z1211 Encounter for screening for malignant neoplasm of colon: Secondary | ICD-10-CM | POA: Diagnosis not present

## 2016-08-26 DIAGNOSIS — G4733 Obstructive sleep apnea (adult) (pediatric): Secondary | ICD-10-CM | POA: Insufficient documentation

## 2016-08-26 DIAGNOSIS — N400 Enlarged prostate without lower urinary tract symptoms: Secondary | ICD-10-CM | POA: Insufficient documentation

## 2016-08-26 DIAGNOSIS — M779 Enthesopathy, unspecified: Secondary | ICD-10-CM | POA: Insufficient documentation

## 2016-08-26 DIAGNOSIS — Z125 Encounter for screening for malignant neoplasm of prostate: Secondary | ICD-10-CM

## 2016-08-26 DIAGNOSIS — L259 Unspecified contact dermatitis, unspecified cause: Secondary | ICD-10-CM

## 2016-08-26 DIAGNOSIS — Z23 Encounter for immunization: Secondary | ICD-10-CM

## 2016-08-26 DIAGNOSIS — K645 Perianal venous thrombosis: Secondary | ICD-10-CM | POA: Insufficient documentation

## 2016-08-26 DIAGNOSIS — E7800 Pure hypercholesterolemia, unspecified: Secondary | ICD-10-CM | POA: Insufficient documentation

## 2016-08-26 LAB — POCT URINALYSIS DIPSTICK
BILIRUBIN UA: NEGATIVE
Blood, UA: NEGATIVE
Glucose, UA: NEGATIVE
KETONES UA: NEGATIVE
Leukocytes, UA: NEGATIVE
Nitrite, UA: NEGATIVE
PH UA: 6
Protein, UA: NEGATIVE
SPEC GRAV UA: 1.02
Urobilinogen, UA: 0.2

## 2016-08-26 LAB — IFOBT (OCCULT BLOOD): IMMUNOLOGICAL FECAL OCCULT BLOOD TEST: NEGATIVE

## 2016-08-26 MED ORDER — MOMETASONE FUROATE 0.1 % EX CREA: 1.0000 "application " | TOPICAL_CREAM | Freq: Every day | CUTANEOUS | 1 refills | Status: DC

## 2016-08-26 NOTE — Progress Notes (Signed)
Patient: Harry Padilla, Male    DOB: 1956/05/19, 60 y.o.   MRN: [id number] Visit Date: 08/26/2016  Today's Provider: Wilhemena Durie, MD   Chief Complaint  Patient presents with  . Annual Exam   Subjective:    Annual physical exam Harry Padilla is a 60 y.o. male who presents today for health maintenance and complete physical. He feels well. He reports he is not exercising. He reports he is sleeping well.  ----------------------------------------------------------------- Colonoscopy- 02/10/08 Tubular adenoma repeat 01/2011 No record of pt having it repeated.  Immunization History  Administered Date(s) Administered  . Influenza Split 07/11/2007, 08/29/2012  . Influenza,inj,Quad PF,36+ Mos 06/15/2013  . Tdap 07/11/2007   Flu vaccine given today, Pt wants shingles but with the holidays coming up does not want it now due to his 52 week old grand baby.    Review of Systems  Constitutional: Negative.   HENT: Negative.   Eyes: Negative.   Respiratory: Positive for apnea.   Cardiovascular: Negative.   Gastrointestinal: Negative.   Endocrine: Negative.   Genitourinary: Negative.   Musculoskeletal: Negative.   Skin: Positive for rash (on chest).  Allergic/Immunologic: Negative.   Neurological: Negative.   Hematological: Negative.   Psychiatric/Behavioral: Negative.     Social History      He  reports that he has never smoked. He has never used smokeless tobacco. He reports that he does not drink alcohol or use drugs.       Social History   Social History  . Marital status: Married    Spouse name: N/A  . Number of children: 2  . Years of education: N/A   Occupational History  . [employer]   . 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 Smoking status: Never Smoker  . Smokeless tobacco: Never Used  . Alcohol use No  . Drug use: No  . Sexual activity: Not Asked   Other Topics Concern  . None   Social  History Narrative  . None    Past Medical History:  Diagnosis Date  . BPH (benign prostatic hyperplasia)   . Cancer ([place]) 2013   carcenoid - lung  . Enthesopathy   . Esophagus disorder 02/10/08   stretched at [facility]  . Hypercholesterolemia   . Hypertension   . Obesity   . OSA (obstructive sleep apnea)    cpap     last study 10 yrs ago  . Viral pneumonia      Patient Active Problem List   Diagnosis Date Noted  . Benign fibroma of prostate 08/26/2016  . Enthesopathy 08/26/2016  . Thrombosed hemorrhoids 08/26/2016  . Hypercholesteremia 08/26/2016  . Obstructive apnea 08/26/2016  . Surgery, elective 08/07/2015  . Carcinoid tumor 11/08/2013  . S/P lobectomy of lung 11/08/2013  . Mass of middle lobe of right lung 01/13/2012  . BP (high blood pressure) 01/13/2012    Past Surgical History:  Procedure Laterality Date  . CATARACT EXTRACTION W/PHACO Left 11/25/2015   Procedure: CATARACT EXTRACTION PHACO AND INTRAOCULAR LENS PLACEMENT (IOC);  Surgeon: Ronnell Freshwater, MD;  Location: [place];  Service: Ophthalmology;  Laterality: Left;  CPAP  . GAS INSERTION Left 08/07/2015   Procedure: INSERTION OF GAS-[id number];  Surgeon: Hurman Horn, MD;  Location: [place];  Service: Ophthalmology;  Laterality: Left;  . LASIK Bilateral 2002  . LUNG CANCER SURGERY    . PARS PLANA VITRECTOMY Left  08/07/2015   Procedure: PARS PLANA VITRECTOMY WITH 25 GAUGE ;  Surgeon: Hurman Horn, MD;  Location: [place];  Service: Ophthalmology;  Laterality: Left;  . PHOTOCOAGULATION WITH LASER Left 08/07/2015   Procedure: PHOTOCOAGULATION WITH LASER-ENDOLASER;  Surgeon: Hurman Horn, MD;  Location: [place];  Service: Ophthalmology;  Laterality: Left;  Marland Kitchen VIDEO BRONCHOSCOPY  01/26/2012   Procedure: VIDEO BRONCHOSCOPY WITHOUT FLUORO;  Surgeon: Tanda Rockers, MD;  Location: [facility];  Service: Cardiopulmonary;  Laterality: Bilateral;  . VIDEO BRONCHOSCOPY  02/04/2012   Procedure: VIDEO BRONCHOSCOPY;   Surgeon: Gaye Pollack, MD;  Location: [facility] OR;  Service: Thoracic;  Laterality: N/A;    Family History        Family Status  Relation Status  . Mother         His family history includes Heart disease in his mother.     Allergies  Allergen Reactions  . Penicillins Rash     As child     Current Outpatient Prescriptions:  .  amLODipine (NORVASC) 5 MG tablet, TAKE 1 TABLET BY MOUTH EVERY DAY, Disp: 30 tablet, Rfl: 8 .  aspirin EC 81 MG tablet, Take 81 mg by mouth at bedtime., Disp: , Rfl:  .  loratadine (CLARITIN) 10 MG tablet, Take 10 mg by mouth at bedtime as needed (seasonal allergies)., Disp: , Rfl:  .  losartan (COZAAR) 100 MG tablet, TAKE 1 TABLET BY MOUTH EVERY DAY, Disp: 30 tablet, Rfl: 11 .  Multiple Vitamin (MULTIVITAMIN WITH MINERALS) TABS tablet, Take 1 tablet by mouth daily., Disp: , Rfl:  .  niacin 500 MG tablet, Take 500 mg by mouth at bedtime., Disp: , Rfl:  .  PRESCRIPTION MEDICATION, at bedtime. CPAP, Disp: , Rfl:  .  vitamin E 400 UNIT capsule, Take 400 Units by mouth at bedtime. , Disp: , Rfl:  .  naproxen (NAPROSYN) 500 MG tablet, Take 1 tablet (500 mg total) by mouth 2 (two) times daily with a meal. (Patient not taking: Reported on 08/26/2016), Disp: 60 tablet, Rfl: 5 .  ofloxacin (OCUFLOX) 0.3 % ophthalmic solution, Reported on 11/25/2015, Disp: , Rfl: 0   Patient Care Team: Jerrol Banana., MD as PCP - General (Unknown Physician Specialty)      Objective:   Vitals: BP 140/70 (BP Location: Left Arm, Patient Position: Sitting, Cuff Size: Large)   Pulse 68   Temp 97.7 F (36.5 C) (Oral)   Resp 16   Ht '5\' 8"'$  (1.727 m)   Wt 222 lb (100.7 kg)   BMI 33.75 kg/m    Physical Exam  Constitutional: He is oriented to person, place, and time. He appears well-developed and well-nourished.  HENT:  Head: Normocephalic and atraumatic.  Right Ear: External ear normal.  Left Ear: External ear normal.  Nose: Nose normal.  Mouth/Throat: Oropharynx is clear and  moist.  Eyes: Conjunctivae and EOM are normal. Pupils are equal, round, and reactive to light.  Neck: Normal range of motion. Neck supple.  Cardiovascular: Normal rate, regular rhythm, normal heart sounds and intact distal pulses.   Pulmonary/Chest: Effort normal and breath sounds normal.  Abdominal: Soft. Bowel sounds are normal.  Genitourinary: Rectum normal, prostate normal and penis normal.  Musculoskeletal: Normal range of motion.  Neurological: He is alert and oriented to person, place, and time. He has normal reflexes.  Skin: Skin is warm and dry.  Mild thickening of the skin underneath both chest/breast area  Areas.  Psychiatric: He has a normal  mood and affect. His behavior is normal. Judgment and thought content normal.     Depression Screen PHQ 2/9 Scores 08/26/2016  PHQ - 2 Score 0      Assessment & Plan:     Routine Health Maintenance and Physical Exam  Exercise Activities and Dietary recommendations Goals    None      Immunization History  Administered Date(s) Administered  . Influenza Split 07/11/2007, 08/29/2012  . Influenza,inj,Quad PF,36+ Mos 06/15/2013  . Tdap 07/11/2007    Health Maintenance  Topic Date Due  . Hepatitis C Screening  [birth year]  . HIV Screening  01/04/1969  . TETANUS/TDAP  01/04/1973  . COLONOSCOPY  01/05/2004  . ZOSTAVAX  01/04/2014  . INFLUENZA VACCINE  04/14/2016    Diet and exercise discussed at length. Goal of 34-36 inch waist size Discussed health benefits of physical activity, and encouraged him to engage in regular exercise appropriate for his age and condition.  Lung cancer/carcinoid tumor Removed Left retinal tear 2016. Dermatitis of lower chest Try mometasone cream and refer to dermatology. Obstructive sleep apnea Patient wears CPAP nightly   --------------------------------------------------------------------  I have done the exam and reviewed the above chart and it is accurate to the best of my knowledge.  Development worker, community has been used in this note in any air is in the dictation or transcription are unintentional.   Wilhemena Durie, MD  [place]

## 2016-08-27 LAB — CBC WITH DIFFERENTIAL/PLATELET
BASOS ABS: 0 10*3/uL (ref 0.0–0.2)
Basos: 1 %
EOS (ABSOLUTE): 0.2 10*3/uL (ref 0.0–0.4)
Eos: 3 %
HEMOGLOBIN: 15 g/dL (ref 13.0–17.7)
Hematocrit: 42.3 % (ref 37.5–51.0)
Immature Grans (Abs): 0 10*3/uL (ref 0.0–0.1)
Immature Granulocytes: 0 %
LYMPHS ABS: 2.1 10*3/uL (ref 0.7–3.1)
Lymphs: 29 %
MCH: 30.8 pg (ref 26.6–33.0)
MCHC: 35.5 g/dL (ref 31.5–35.7)
MCV: 87 fL (ref 79–97)
MONOS ABS: 0.8 10*3/uL (ref 0.1–0.9)
Monocytes: 10 %
NEUTROS ABS: 4.3 10*3/uL (ref 1.4–7.0)
Neutrophils: 57 %
PLATELETS: 261 10*3/uL (ref 150–379)
RBC: 4.87 x10E6/uL (ref 4.14–5.80)
RDW: 14.4 % (ref 12.3–15.4)
WBC: 7.4 10*3/uL (ref 3.4–10.8)

## 2016-08-27 LAB — COMPREHENSIVE METABOLIC PANEL
ALT: 20 IU/L (ref 0–44)
AST: 17 IU/L (ref 0–40)
Albumin/Globulin Ratio: 1.2 (ref 1.2–2.2)
Albumin: 4 g/dL (ref 3.6–4.8)
Alkaline Phosphatase: 82 IU/L (ref 39–117)
BUN / CREAT RATIO: 22 (ref 10–24)
BUN: 19 mg/dL (ref 8–27)
Bilirubin Total: 0.3 mg/dL (ref 0.0–1.2)
CALCIUM: 9.5 mg/dL (ref 8.6–10.2)
CHLORIDE: 104 mmol/L (ref 96–106)
CO2: 19 mmol/L (ref 18–29)
Creatinine, Ser: 0.88 mg/dL (ref 0.76–1.27)
GFR calc non Af Amer: 92 mL/min/{1.73_m2} (ref >59)
GFR, EST AFRICAN AMERICAN: 106 mL/min/{1.73_m2} (ref >59)
GLOBULIN, TOTAL: 3.4 g/dL (ref 1.5–4.5)
Glucose: 108 mg/dL — ABNORMAL HIGH (ref 65–99)
POTASSIUM: 4.2 mmol/L (ref 3.5–5.2)
Sodium: 144 mmol/L (ref 134–144)
Total Protein: 7.4 g/dL (ref 6.0–8.5)

## 2016-08-27 LAB — LIPID PANEL WITH LDL/HDL RATIO
CHOLESTEROL TOTAL: 199 mg/dL (ref 100–199)
HDL: 38 mg/dL — ABNORMAL LOW (ref >39)
LDL CALC: 93 mg/dL (ref 0–99)
LDl/HDL Ratio: 2.4 ratio units (ref 0.0–3.6)
Triglycerides: 342 mg/dL — ABNORMAL HIGH (ref 0–149)
VLDL Cholesterol Cal: 68 mg/dL — ABNORMAL HIGH (ref 5–40)

## 2016-08-27 LAB — TSH: TSH: 2.71 u[IU]/mL (ref 0.450–4.500)

## 2016-08-27 LAB — PSA: PROSTATE SPECIFIC AG, SERUM: 1.9 ng/mL (ref 0.0–4.0)

## 2016-08-28 ENCOUNTER — Telehealth: Payer: Self-pay

## 2016-08-28 NOTE — Telephone Encounter (Signed)
[id number] 

## 2016-08-28 NOTE — Telephone Encounter (Signed)
-----   Message from Jerrol Banana., MD sent at 08/28/2016  7:19 AM EST ----- Labs okay. As discussed at time of physical, work on diet and exercise to help with high lipids and prediabetes

## 2016-08-28 NOTE — Telephone Encounter (Signed)
Pt advised-aa 

## 2016-09-09 ENCOUNTER — Ambulatory Visit (INDEPENDENT_AMBULATORY_CARE_PROVIDER_SITE_OTHER): Payer: BLUE CROSS/BLUE SHIELD | Admitting: Physician Assistant

## 2016-09-09 ENCOUNTER — Encounter: Payer: Self-pay | Admitting: Physician Assistant

## 2016-09-09 VITALS — BP 144/82 | HR 62 | Temp 98.0°F | Resp 16 | Wt 222.0 lb

## 2016-09-09 DIAGNOSIS — J029 Acute pharyngitis, unspecified: Secondary | ICD-10-CM | POA: Diagnosis not present

## 2016-09-09 LAB — POCT RAPID STREP A (OFFICE): Rapid Strep A Screen: NEGATIVE

## 2016-09-09 NOTE — Patient Instructions (Signed)
Sore Throat When you have a sore throat, your throat may:  Hurt.  Burn.  Feel irritated.  Feel scratchy. Many things can cause a sore throat, including:  An infection.  Allergies.  Dryness in the air.  Smoke or pollution.  Gastroesophageal reflux disease (GERD).  A tumor. A sore throat can be the first sign of another sickness. It can happen with other problems, like coughing or a fever. Most sore throats go away without treatment. Follow these instructions at home:  Take over-the-counter medicines only as told by your doctor.  Drink enough fluids to keep your pee (urine) clear or pale yellow.  Rest when you feel you need to.  To help with pain, try:  Sipping warm liquids, such as broth, herbal tea, or warm water.  Eating or drinking cold or frozen liquids, such as frozen ice pops.  Gargling with a salt-water mixture 3-4 times a day or as needed. To make a salt-water mixture, add -1 tsp of salt in 1 cup of warm water. Mix it until you cannot see the salt anymore.  Sucking on hard candy or throat lozenges.  Putting a cool-mist humidifier in your bedroom at night.  Sitting in the bathroom with the door closed for 5-10 minutes while you run hot water in the shower.  Do not use any tobacco products, such as cigarettes, chewing tobacco, and e-cigarettes. If you need help quitting, ask your doctor. Contact a doctor if:  You have a fever for more than 2-3 days.  You keep having symptoms for more than 2-3 days.  Your throat does not get better in 7 days.  You have a fever and your symptoms suddenly get worse. Get help right away if:  You have trouble breathing.  You cannot swallow fluids, soft foods, or your saliva.  You have swelling in your throat or neck that gets worse.  You keep feeling like you are going to throw up (vomit).  You keep throwing up. This information is not intended to replace advice given to you by your health care provider. Make sure  you discuss any questions you have with your health care provider. Document Released: 06/09/2008 Document Revised: 04/26/2016 Document Reviewed: 06/21/2015 Elsevier Interactive Patient Education  2017 Elsevier Inc.  

## 2016-09-09 NOTE — Progress Notes (Signed)
Patient: Harry Padilla Male    DOB: 1956/06/13   60 y.o.   MRN: [id number] Visit Date: 09/09/2016  Today's Provider: Trinna Post, PA-C   Chief Complaint  Patient presents with  . Sore Throat   Subjective:    HPI Pt is here for a sore throat. He reports that it started about 3 days ago. He denies any fevers, chills, sweats, body aches. He does have a cough that keeps him up at night, headache, runny nose, and post nasal drainage. Does take Claritin daily. He is going out of town this weekend and wants to make sure he does not get worse while he is gone. He is concerned about strep and possible side effects from where he got the flu vaccine on 08/26/2016.     Allergies  Allergen Reactions  . Penicillins Rash     As child     Current Outpatient Prescriptions:  .  amLODipine (NORVASC) 5 MG tablet, TAKE 1 TABLET BY MOUTH EVERY DAY, Disp: 30 tablet, Rfl: 8 .  aspirin EC 81 MG tablet, Take 81 mg by mouth at bedtime., Disp: , Rfl:  .  loratadine (CLARITIN) 10 MG tablet, Take 10 mg by mouth at bedtime as needed (seasonal allergies)., Disp: , Rfl:  .  losartan (COZAAR) 100 MG tablet, TAKE 1 TABLET BY MOUTH EVERY DAY, Disp: 30 tablet, Rfl: 11 .  mometasone (ELOCON) 0.1 % cream, Apply 1 application topically daily., Disp: 45 g, Rfl: 1 .  Multiple Vitamin (MULTIVITAMIN WITH MINERALS) TABS tablet, Take 1 tablet by mouth daily., Disp: , Rfl:  .  niacin 500 MG tablet, Take 500 mg by mouth at bedtime., Disp: , Rfl:  .  PRESCRIPTION MEDICATION, at bedtime. CPAP, Disp: , Rfl:  .  vitamin E 400 UNIT capsule, Take 400 Units by mouth at bedtime. , Disp: , Rfl:  .  naproxen (NAPROSYN) 500 MG tablet, Take 1 tablet (500 mg total) by mouth 2 (two) times daily with a meal. (Patient not taking: Reported on 09/09/2016), Disp: 60 tablet, Rfl: 5 .  ofloxacin (OCUFLOX) 0.3 % ophthalmic solution, Reported on 11/25/2015, Disp: , Rfl: 0  Review of Systems  Constitutional: Negative.   HENT:  Positive for postnasal drip, rhinorrhea and sore throat.   Eyes: Negative.   Respiratory: Positive for cough.   Cardiovascular: Negative.   Gastrointestinal: Negative.   Endocrine: Negative.   Genitourinary: Negative.   Musculoskeletal: Negative.   Skin: Negative.   Allergic/Immunologic: Negative.   Neurological: Negative.   Hematological: Negative.   Psychiatric/Behavioral: Negative.     Social History  Substance Use Topics  . Smoking status: Never Smoker  . Smokeless tobacco: Never Used  . Alcohol use No   Objective:   BP (!) 144/82 (BP Location: Right Arm, Patient Position: Sitting, Cuff Size: Normal)   Pulse 62   Temp 98 F (36.7 C) (Oral)   Resp 16   Wt 222 lb (100.7 kg)   SpO2 98%   BMI 33.75 kg/m   Physical Exam  Constitutional: He is oriented to person, place, and time. He appears well-developed and well-nourished. No distress.  HENT:  Right Ear: External ear normal.  Left Ear: External ear normal.  Nose: Nose normal.  Mouth/Throat: Uvula is midline and mucous membranes are normal. Posterior oropharyngeal erythema present. No oropharyngeal exudate or posterior oropharyngeal edema.  Eyes: Conjunctivae are normal.  Neck: Neck supple.  Cardiovascular: Normal rate and regular rhythm.   Pulmonary/Chest: Effort normal  and breath sounds normal. No respiratory distress. He has no wheezes.  Lymphadenopathy:    He has no cervical adenopathy.  Neurological: He is alert and oriented to person, place, and time.  Skin: Skin is warm and dry.  Psychiatric: He has a normal mood and affect. His behavior is normal.  Vitals reviewed.       Assessment & Plan:     1. Sore throat  In office rapid strep today negative. Throat erythematous but not particularly edematous. In light of patient's report that he is improving, likely viral with some allergy component. Advised he may take Benadryl at night for PND/runny nose but avoid decongestants. Can use symptomatic treatment for  relief. Can call back if sore throat persistent and worsening and we may send abx in. Advised that pt likely out of window where he would have side effects of flu shot.   - POCT rapid strep A  Return if symptoms worsen or fail to improve.    Patient Instructions  Sore Throat When you have a sore throat, your throat may:  Hurt.  Burn.  Feel irritated.  Feel scratchy. Many things can cause a sore throat, including:  An infection.  Allergies.  Dryness in the air.  Smoke or pollution.  Gastroesophageal reflux disease (GERD).  A tumor. A sore throat can be the first sign of another sickness. It can happen with other problems, like coughing or a fever. Most sore throats go away without treatment. Follow these instructions at home:  Take over-the-counter medicines only as told by your doctor.  Drink enough fluids to keep your pee (urine) clear or pale yellow.  Rest when you feel you need to.  To help with pain, try:  Sipping warm liquids, such as broth, herbal tea, or warm water.  Eating or drinking cold or frozen liquids, such as frozen ice pops.  Gargling with a salt-water mixture 3-4 times a day or as needed. To make a salt-water mixture, add -1 tsp of salt in 1 cup of warm water. Mix it until you cannot see the salt anymore.  Sucking on hard candy or throat lozenges.  Putting a cool-mist humidifier in your bedroom at night.  Sitting in the bathroom with the door closed for 5-10 minutes while you run hot water in the shower.  Do not use any tobacco products, such as cigarettes, chewing tobacco, and e-cigarettes. If you need help quitting, ask your doctor. Contact a doctor if:  You have a fever for more than 2-3 days.  You keep having symptoms for more than 2-3 days.  Your throat does not get better in 7 days.  You have a fever and your symptoms suddenly get worse. Get help right away if:  You have trouble breathing.  You cannot swallow fluids, soft  foods, or your saliva.  You have swelling in your throat or neck that gets worse.  You keep feeling like you are going to throw up (vomit).  You keep throwing up. This information is not intended to replace advice given to you by your health care provider. Make sure you discuss any questions you have with your health care provider. Document Released: 06/09/2008 Document Revised: 04/26/2016 Document Reviewed: 06/21/2015 Elsevier Interactive Patient Education  2017 [employer].   The entirety of the information documented in the History of Present Illness, Review of Systems and Physical Exam were personally obtained by me. Portions of this information were initially documented by [place] and reviewed by me for thoroughness  and accuracy.            Trinna Post, PA-C  [facility]

## 2016-10-06 DIAGNOSIS — E042 Nontoxic multinodular goiter: Secondary | ICD-10-CM | POA: Diagnosis not present

## 2016-10-16 ENCOUNTER — Encounter: Payer: Self-pay | Admitting: Family Medicine

## 2016-10-29 DIAGNOSIS — H2511 Age-related nuclear cataract, right eye: Secondary | ICD-10-CM | POA: Diagnosis not present

## 2016-10-29 DIAGNOSIS — H33032 Retinal detachment with giant retinal tear, left eye: Secondary | ICD-10-CM | POA: Diagnosis not present

## 2016-10-29 DIAGNOSIS — H35372 Puckering of macula, left eye: Secondary | ICD-10-CM | POA: Diagnosis not present

## 2016-10-29 DIAGNOSIS — Z961 Presence of intraocular lens: Secondary | ICD-10-CM | POA: Diagnosis not present

## 2016-10-29 DIAGNOSIS — E042 Nontoxic multinodular goiter: Secondary | ICD-10-CM | POA: Diagnosis not present

## 2016-10-30 DIAGNOSIS — E042 Nontoxic multinodular goiter: Secondary | ICD-10-CM | POA: Diagnosis not present

## 2016-11-18 DIAGNOSIS — H26492 Other secondary cataract, left eye: Secondary | ICD-10-CM | POA: Diagnosis not present

## 2016-11-18 DIAGNOSIS — H35372 Puckering of macula, left eye: Secondary | ICD-10-CM | POA: Diagnosis not present

## 2016-11-26 DIAGNOSIS — H35372 Puckering of macula, left eye: Secondary | ICD-10-CM | POA: Diagnosis not present

## 2016-12-28 DIAGNOSIS — H35372 Puckering of macula, left eye: Secondary | ICD-10-CM | POA: Diagnosis not present

## 2017-04-11 ENCOUNTER — Other Ambulatory Visit: Payer: Self-pay | Admitting: Family Medicine

## 2017-04-13 DIAGNOSIS — H35372 Puckering of macula, left eye: Secondary | ICD-10-CM | POA: Diagnosis not present

## 2017-04-13 DIAGNOSIS — H2511 Age-related nuclear cataract, right eye: Secondary | ICD-10-CM | POA: Diagnosis not present

## 2017-04-13 DIAGNOSIS — H33032 Retinal detachment with giant retinal tear, left eye: Secondary | ICD-10-CM | POA: Diagnosis not present

## 2017-04-13 DIAGNOSIS — H35352 Cystoid macular degeneration, left eye: Secondary | ICD-10-CM | POA: Diagnosis not present

## 2017-05-05 ENCOUNTER — Encounter: Payer: BLUE CROSS/BLUE SHIELD | Admitting: Surgery

## 2017-05-13 DIAGNOSIS — H35352 Cystoid macular degeneration, left eye: Secondary | ICD-10-CM | POA: Diagnosis not present

## 2017-05-13 DIAGNOSIS — H2511 Age-related nuclear cataract, right eye: Secondary | ICD-10-CM | POA: Diagnosis not present

## 2017-05-13 DIAGNOSIS — H33032 Retinal detachment with giant retinal tear, left eye: Secondary | ICD-10-CM | POA: Diagnosis not present

## 2017-05-13 DIAGNOSIS — H35372 Puckering of macula, left eye: Secondary | ICD-10-CM | POA: Diagnosis not present

## 2017-05-18 ENCOUNTER — Other Ambulatory Visit: Payer: Self-pay | Admitting: Surgery

## 2017-05-18 DIAGNOSIS — Z902 Acquired absence of lung [part of]: Secondary | ICD-10-CM

## 2017-05-19 ENCOUNTER — Encounter: Payer: Self-pay | Admitting: Surgery

## 2017-05-19 ENCOUNTER — Ambulatory Visit (INDEPENDENT_AMBULATORY_CARE_PROVIDER_SITE_OTHER): Payer: BLUE CROSS/BLUE SHIELD | Admitting: Surgery

## 2017-05-19 ENCOUNTER — Ambulatory Visit
Admission: RE | Admit: 2017-05-19 | Discharge: 2017-05-19 | Disposition: A | Discharge status: Home / Self Care | Payer: BLUE CROSS/BLUE SHIELD | Source: Ambulatory Visit | Attending: Surgery | Admitting: Surgery

## 2017-05-19 VITALS — BP 142/86 | HR 65 | Resp 16 | Ht 68.0 in | Wt 226.0 lb

## 2017-05-19 DIAGNOSIS — Z902 Acquired absence of lung [part of]: Secondary | ICD-10-CM

## 2017-05-19 DIAGNOSIS — S299XXA Unspecified injury of thorax, initial encounter: Secondary | ICD-10-CM | POA: Diagnosis not present

## 2017-05-19 DIAGNOSIS — D3A00 Benign carcinoid tumor of unspecified site: Secondary | ICD-10-CM | POA: Diagnosis not present

## 2017-05-19 NOTE — Progress Notes (Signed)
HPI:  The patient returns today for follow up status post right thoracotomy and right middle lobectomy on 02/04/2012 for a T1A, N0 low-grade neuroendocrine tumor. He denies any cough or sputum production. He denies hemoptysis. He had no shortness of breath. He denies any headaches. He has not been very active over the past two years due to extended treatment for a retinal detachment which has required 4 surgeries but luckily he has 20-60 vision now in his left eye. He says that his exercise restrictions have been lifted now.  Current Outpatient Prescriptions  Medication Sig Dispense Refill  . amLODipine (NORVASC) 5 MG tablet TAKE 1 TABLET BY MOUTH EVERY DAY 90 tablet 3  . aspirin EC 81 MG tablet Take 81 mg by mouth at bedtime.    Marland Kitchen ketorolac (ACULAR) 0.5 % ophthalmic solution Place 1 drop into the left eye 4 (four) times daily.    Marland Kitchen loratadine (CLARITIN) 10 MG tablet Take 10 mg by mouth at bedtime as needed (seasonal allergies).    . losartan (COZAAR) 100 MG tablet TAKE 1 TABLET BY MOUTH EVERY DAY 30 tablet 11  . Multiple Vitamin (MULTIVITAMIN WITH MINERALS) TABS tablet Take 1 tablet by mouth daily.    . naproxen (NAPROSYN) 500 MG tablet Take 1 tablet (500 mg total) by mouth 2 (two) times daily with a meal. 60 tablet 5  . niacin 500 MG tablet Take 500 mg by mouth at bedtime.    Marland Kitchen PRESCRIPTION MEDICATION at bedtime. CPAP    . vitamin E 400 UNIT capsule Take 400 Units by mouth at bedtime.      No current facility-administered medications for this visit.      Physical Exam: BP (!) 142/86 (BP Location: Right Arm, Patient Position: Sitting, Cuff Size: Large)   Pulse 65   Resp 16   Ht 5\' 8"  (1.727 m)   Wt 226 lb (102.5 kg)   SpO2 96% Comment: ON RA  BMI 34.36 kg/m  He has gained 4 lbs since last year. He looks well.  There is no cervical or supraclavicular adenopathy.  The right chest incision is well-healed. There are no skin lesions or subcutaneous nodules.  Lung exam is  clear.  Cardiac exam shows a regular rate and rhythm with normal heart sounds.    Diagnostic Tests:  CLINICAL DATA:  Right middle lobectomy for carcinoid tumor.  EXAM: CHEST  2 VIEW  COMPARISON:  CT chest 05/06/2016  FINDINGS: The heart size is normal. Atherosclerotic calcification again noted in the aorta. Volume loss in the right lung is stable. No focal airspace disease is present. There is no edema or effusion.  IMPRESSION: 1. No acute cardiopulmonary disease or significant interval change.   Electronically Signed   By: San Morelle M.D.   On: 05/19/2017 09:32   Impression:  He continues to do well overall with no evidence of recurrent tumor 5 years after resection. He should have a yearly CXR. I discussed the importance of watching his diet and food intake closely and getting some regular aerobic exercise. He has gained a lot of weight since I first treated him. His prior chest CT's showed significant calcified plaque in his proximal left coronary artery system and while that does not mean he has any coronary blockage it is an indicator that he is at risk. He currently denies any symptoms of exertional chest pain or shortness of breath. I reviewed this scan with him and the warning signs of coronary ischemia.  Plan:  I will plan to see him back in one year with a CXR. He will follow up with Dr. Rosanna Randy otherwise.   Gaye Pollack, MD [facility] [phone]

## 2017-06-23 DIAGNOSIS — H33032 Retinal detachment with giant retinal tear, left eye: Secondary | ICD-10-CM | POA: Diagnosis not present

## 2017-06-23 DIAGNOSIS — H35372 Puckering of macula, left eye: Secondary | ICD-10-CM | POA: Diagnosis not present

## 2017-06-23 DIAGNOSIS — H2511 Age-related nuclear cataract, right eye: Secondary | ICD-10-CM | POA: Diagnosis not present

## 2017-06-23 DIAGNOSIS — H35352 Cystoid macular degeneration, left eye: Secondary | ICD-10-CM | POA: Diagnosis not present

## 2017-07-08 ENCOUNTER — Other Ambulatory Visit: Payer: Self-pay | Admitting: Family Medicine

## 2017-09-03 ENCOUNTER — Ambulatory Visit: Payer: BLUE CROSS/BLUE SHIELD | Admitting: Family Medicine

## 2017-09-03 ENCOUNTER — Encounter: Payer: Self-pay | Admitting: Family Medicine

## 2017-09-03 VITALS — BP 152/90 | HR 76 | Temp 99.0°F | Resp 16 | Wt 227.8 lb

## 2017-09-03 DIAGNOSIS — J069 Acute upper respiratory infection, unspecified: Secondary | ICD-10-CM | POA: Diagnosis not present

## 2017-09-03 DIAGNOSIS — R490 Dysphonia: Secondary | ICD-10-CM | POA: Diagnosis not present

## 2017-09-03 MED ORDER — PREDNISONE 20 MG PO TABS
ORAL_TABLET | ORAL | 0 refills | Status: DC
Start: 2017-09-03 — End: 2017-10-12

## 2017-09-03 NOTE — Progress Notes (Signed)
Subjective:     Patient ID: Harry Padilla, male   DOB: 11/25/1955, 61 y.o.   MRN: [id number] Chief Complaint  Patient presents with  . Cough    Patient comes in office today with complaints of cough and congestion for one week. Patient states the other day he had lost his voice, patient has tried otc Tussin DM.    HPI Reports cold sx for the last week. 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 Wishes medication to help with his hoarseness.  Review of Systems     Objective:   Physical Exam  Constitutional: He appears well-developed and well-nourished. No distress.  Ears: T.M's intact without inflammation Throat: no tonsillar enlargement or exudate Neck: left anterior cervical node Lungs: clear     Assessment:    1. Viral upper respiratory tract infection  2. Hoarseness - predniSONE (DELTASONE) 20 MG tablet; Take one pill today then one tomorrow AM for hoarsenss  Dispense: 2 tablet; Refill: 0    Plan:    Discussed use of Delsym and saline nasal spray. Avoid decongestant due to elevated blood pressure.

## 2017-09-03 NOTE — Patient Instructions (Signed)
Continue Tussin DM. Use saline nasal spray for congestion. May also try Delsym for cough. Avoid decongestants due to elevated blood pressure.

## 2017-09-09 ENCOUNTER — Other Ambulatory Visit: Payer: Self-pay | Admitting: Family Medicine

## 2017-09-09 NOTE — Telephone Encounter (Signed)
[facility] faxed a refill request for a 90-days supply for the following medication. Thanks CC  losartan (COZAAR) 100 MG tablet

## 2017-09-11 ENCOUNTER — Other Ambulatory Visit: Payer: Self-pay | Admitting: Family Medicine

## 2017-09-13 ENCOUNTER — Telehealth: Payer: Self-pay | Admitting: Family Medicine

## 2017-09-13 NOTE — Telephone Encounter (Signed)
[facility] faxed a refill request for a 90-days supply for the following medication. Thanks CC  losartan (COZAAR) 100 MG tablet

## 2017-10-12 ENCOUNTER — Ambulatory Visit: Payer: BLUE CROSS/BLUE SHIELD | Admitting: Family Medicine

## 2017-10-12 ENCOUNTER — Other Ambulatory Visit: Payer: Self-pay

## 2017-10-12 VITALS — BP 146/80 | HR 82 | Temp 98.0°F | Resp 16 | Wt 224.0 lb

## 2017-10-12 DIAGNOSIS — K047 Periapical abscess without sinus: Secondary | ICD-10-CM

## 2017-10-12 MED ORDER — AMOXICILLIN-POT CLAVULANATE 875-125 MG PO TABS: 1.0000 | ORAL_TABLET | Freq: Two times a day (BID) | ORAL | 0 refills | Status: DC

## 2017-10-12 MED ORDER — NAPROXEN 500 MG PO TABS: 500.0000 mg | ORAL_TABLET | Freq: Two times a day (BID) | ORAL | 0 refills | Status: DC

## 2017-10-12 NOTE — Progress Notes (Signed)
Harry Padilla  MRN: [id number] DOB: 06/15/1956  Subjective:  HPI   The patient is a 12 male who presents for evaluation of jaw pain.  The patient reports being out of town at Christmas and he broke his tooth on some popcorn.  He was unable to see his dentist until 09/16/17.  At that time the dentist rebuilt the tooth.  Since that time he has had to go back to the dentist 2 more times because of the pain and swelling in his jaw behind the tooth that was repaired.  Each time the dentist would just grind the tooth thinking it was not fitting correctly.  The patient says that the pain and swelling is actually where the shot went in and not the actual tooth. He denies any having any fever.  He has not noticed any pus in the area of pain.  Patient Active Problem List   Diagnosis Date Noted  . Benign fibroma of prostate 08/26/2016  . Enthesopathy 08/26/2016  . Thrombosed hemorrhoids 08/26/2016  . Hypercholesteremia 08/26/2016  . Obstructive apnea 08/26/2016  . Surgery, elective 08/07/2015  . Carcinoid tumor 11/08/2013  . S/P lobectomy of lung 11/08/2013  . BP (high blood pressure) 01/13/2012    Past Medical History:  Diagnosis Date  . BPH (benign prostatic hyperplasia)   . Cancer ([place]) 2013   carcenoid - lung  . Enthesopathy   . Esophagus disorder 02/10/08   stretched at [facility]  . Hypercholesterolemia   . Hypertension   . Obesity   . OSA (obstructive sleep apnea)    cpap     last study 10 yrs ago  . Viral pneumonia     Social History   Socioeconomic History  . Marital status: Married    Spouse name: Not on file  . Number of children: 2  . Years of education: Not on file  . Highest education level: Not on file  Social Needs  . Financial resource strain: Not on file  . Food insecurity - worry: Not on file  . Food insecurity - inability: Not on file  . Transportation needs - medical: Not on file  . Transportation needs - non-medical: Not on file  Occupational History  .  Occupation: Theme park manager  . Occupation: Former Patent attorney    Comment: previous asbestos exposure  Tobacco Use  . Smoking status: Never Smoker  . Smokeless tobacco: Never Used  Substance and Sexual Activity  . Alcohol use: No  . Drug use: No  . Sexual activity: Not on file  Other Topics Concern  . Not on file  Social History Narrative  . Not on file    Outpatient Encounter Medications as of 10/12/2017  Medication Sig  . amLODipine (NORVASC) 5 MG tablet TAKE 1 TABLET BY MOUTH EVERY DAY  . aspirin EC 81 MG tablet Take 81 mg by mouth at bedtime.  Marland Kitchen ketorolac (ACULAR) 0.5 % ophthalmic solution Place 1 drop into the left eye 4 (four) times daily.  Marland Kitchen loratadine (CLARITIN) 10 MG tablet Take 10 mg by mouth at bedtime as needed (seasonal allergies).  . losartan (COZAAR) 100 MG tablet TAKE 1 TABLET BY MOUTH EVERY DAY. NEEDS OV FOR FURTHER REFILLS.  . Multiple Vitamin (MULTIVITAMIN WITH MINERALS) TABS tablet Take 1 tablet by mouth daily.  . niacin 500 MG tablet Take 500 mg by mouth at bedtime.  Marland Kitchen PRESCRIPTION MEDICATION at bedtime. CPAP  . vitamin E 400 UNIT capsule Take 400 Units by mouth at bedtime.   . [  DISCONTINUED] naproxen (NAPROSYN) 500 MG tablet Take 1 tablet (500 mg total) by mouth 2 (two) times daily with a meal.  . [DISCONTINUED] predniSONE (DELTASONE) 20 MG tablet Take one pill today then one tomorrow AM for hoarsenss   No facility-administered encounter medications on file as of 10/12/2017.     Allergies  Allergen Reactions  . Penicillins Rash     As child    Review of Systems  Constitutional: Negative for fever and malaise/fatigue.  Eyes: Negative.   Respiratory: Negative for cough, shortness of breath and wheezing.   Cardiovascular: Negative for chest pain, palpitations, orthopnea and leg swelling.  Gastrointestinal: Negative.   Skin: Negative.   Neurological: Negative for weakness.  Endo/Heme/Allergies: Negative.   Psychiatric/Behavioral: Negative.     Objective:    BP (!) 146/80 (BP Location: Right Arm, Patient Position: Sitting, Cuff Size: Normal)   Pulse 82   Temp 98 F (36.7 C) (Oral)   Resp 16   Wt 224 lb (101.6 kg)   BMI 34.06 kg/m   Physical Exam  Assessment and Plan :   1. Dental infection  - amoxicillin-clavulanate (AUGMENTIN) 875-125 MG tablet; Take 1 tablet by mouth 2 (two) times daily. Patient reports he has taken this before without any issue  Dispense: 20 tablet; Refill: 0 - naproxen (NAPROSYN) 500 MG tablet; Take 1 tablet (500 mg total) by mouth 2 (two) times daily with a meal.  Dispense: 30 tablet; Refill: 0  If patient does not improve he is to call and we will do a prednisone taper.   I have done the exam and reviewed the chart and it is accurate to the best of my knowledge. Development worker, community has been used and  any errors in dictation or transcription are unintentional. Miguel Aschoff M.D. [facility]

## 2017-10-19 ENCOUNTER — Other Ambulatory Visit: Payer: Self-pay | Admitting: Family Medicine

## 2017-10-19 DIAGNOSIS — H35352 Cystoid macular degeneration, left eye: Secondary | ICD-10-CM | POA: Diagnosis not present

## 2017-10-19 DIAGNOSIS — H2511 Age-related nuclear cataract, right eye: Secondary | ICD-10-CM | POA: Diagnosis not present

## 2017-10-19 DIAGNOSIS — K047 Periapical abscess without sinus: Secondary | ICD-10-CM

## 2017-10-19 DIAGNOSIS — H33032 Retinal detachment with giant retinal tear, left eye: Secondary | ICD-10-CM | POA: Diagnosis not present

## 2017-10-19 DIAGNOSIS — H35372 Puckering of macula, left eye: Secondary | ICD-10-CM | POA: Diagnosis not present

## 2017-10-19 NOTE — Telephone Encounter (Signed)
[facility] for a 90-days supply for the following medication. Thanks CC  naproxen (NAPROSYN) 500 MG tablet

## 2017-10-20 MED ORDER — NAPROXEN 500 MG PO TABS: 500.0000 mg | ORAL_TABLET | Freq: Two times a day (BID) | ORAL | 11 refills | Status: DC

## 2017-11-02 ENCOUNTER — Other Ambulatory Visit: Payer: Self-pay | Admitting: Family Medicine

## 2017-11-02 MED ORDER — LOSARTAN POTASSIUM 100 MG PO TABS: ORAL_TABLET | ORAL | 1 refills | Status: DC

## 2017-11-02 NOTE — Telephone Encounter (Signed)
[facility] faxed a refill request for a 90-days supply for the following medication. Thanks CC  losartan (COZAAR) 100 MG tablet

## 2017-11-04 ENCOUNTER — Other Ambulatory Visit: Payer: Self-pay | Admitting: Family Medicine

## 2017-11-04 DIAGNOSIS — I10 Essential (primary) hypertension: Secondary | ICD-10-CM

## 2017-11-04 MED ORDER — LOSARTAN POTASSIUM 100 MG PO TABS: ORAL_TABLET | ORAL | 1 refills | Status: DC

## 2017-11-04 NOTE — Telephone Encounter (Signed)
[facility] faxed a refill request for a 90-days supply for the following medication. Thanks CC  losartan (COZAAR) 100 MG tablet

## 2017-11-04 NOTE — Telephone Encounter (Signed)
Sent but pt needs to make an appt. So only sent 30

## 2018-03-08 ENCOUNTER — Other Ambulatory Visit: Payer: Self-pay | Admitting: Family Medicine

## 2018-04-11 ENCOUNTER — Other Ambulatory Visit: Payer: Self-pay | Admitting: Family Medicine

## 2018-04-26 DIAGNOSIS — H35372 Puckering of macula, left eye: Secondary | ICD-10-CM | POA: Diagnosis not present

## 2018-04-26 DIAGNOSIS — H33032 Retinal detachment with giant retinal tear, left eye: Secondary | ICD-10-CM | POA: Diagnosis not present

## 2018-04-26 DIAGNOSIS — H35352 Cystoid macular degeneration, left eye: Secondary | ICD-10-CM | POA: Diagnosis not present

## 2018-04-26 DIAGNOSIS — H2511 Age-related nuclear cataract, right eye: Secondary | ICD-10-CM | POA: Diagnosis not present

## 2018-05-02 ENCOUNTER — Ambulatory Visit: Payer: BLUE CROSS/BLUE SHIELD | Admitting: Family Medicine

## 2018-05-02 ENCOUNTER — Encounter: Payer: Self-pay | Admitting: Family Medicine

## 2018-05-02 ENCOUNTER — Other Ambulatory Visit: Payer: Self-pay | Admitting: Family Medicine

## 2018-05-02 VITALS — BP 140/82 | HR 72 | Temp 98.8°F | Resp 16 | Wt 229.2 lb

## 2018-05-02 DIAGNOSIS — S29011A Strain of muscle and tendon of front wall of thorax, initial encounter: Secondary | ICD-10-CM

## 2018-05-02 NOTE — Patient Instructions (Signed)
Discussed use of two Aleve twice daily with food and Tylenol up to 3000 mg/day.

## 2018-05-02 NOTE — Progress Notes (Signed)
  Subjective:     Patient ID: Harry Padilla, male   DOB: 05-12-1956, 62 y.o.   MRN: [id number] Chief Complaint  Patient presents with  . Chest Pain    Patient comes in office today with concerns of chest pain that has been occuring since 04/27/18. Patient describes pain as sharp and radiatesd under his left arm and up his left shoulder. Patient states that today pain is primarily in his shoulder blades. Patient denies, headaches, shortness of breath, paresthesiasor being lightheaded. Patient has been taking otc Tylenol and Aleve PRN   HPI States his symptoms have improved since last week but is left with residual sharp to prickly pain under his left arm and above his shoulder blade. Reports that he was wrestling with his grandchildren prior to onset of his sx and "body slammed" his [age] year onto the bed with one arm.  Review of Systems     Objective:   Physical Exam  Constitutional: He appears well-developed and well-nourished. No distress.  Cardiovascular: Normal rate and regular rhythm.  Pulmonary/Chest: Breath sounds normal.  Musculoskeletal: He exhibits no edema (of lower extremities).  Cervical and left shoulder with FROM. Grip and left shoulder strength 5/5. Mild tenderness below his left axilla and upper back. No rash.       Assessment:    1. Muscle strain of chest wall, initial encounter     Plan:    Discussed use of two Aleve twice daily with food and tylenol up to 3000 mg/day.

## 2018-05-05 ENCOUNTER — Encounter: Payer: Self-pay | Admitting: Family Medicine

## 2018-05-05 ENCOUNTER — Ambulatory Visit: Payer: BLUE CROSS/BLUE SHIELD | Admitting: Family Medicine

## 2018-05-05 VITALS — BP 152/96 | HR 74 | Temp 98.5°F | Wt 224.0 lb

## 2018-05-05 DIAGNOSIS — B029 Zoster without complications: Secondary | ICD-10-CM

## 2018-05-05 MED ORDER — VALACYCLOVIR HCL 1 G PO TABS: 1000.0000 mg | ORAL_TABLET | Freq: Three times a day (TID) | ORAL | 0 refills | Status: DC

## 2018-05-05 NOTE — Progress Notes (Signed)
Patient: Harry Padilla Male    DOB: [birth year]   62 y.o.   MRN: [id number] Visit Date: 05/05/2018  Today's Provider: Wilhemena Durie, MD   No chief complaint on file.  Subjective:    Rash  This is a new problem. The current episode started in the past 7 days. The problem has been gradually worsening since onset. The affected locations include the chest. The rash is characterized by blistering, redness and itchiness. He was exposed to nothing.       Allergies  Allergen Reactions  . Penicillins Rash     As child     Current Outpatient Medications:  .  amLODipine (NORVASC) 5 MG tablet, TAKE 1 TABLET BY MOUTH EVERY DAY, Disp: 90 tablet, Rfl: 3 .  aspirin EC 81 MG tablet, Take 81 mg by mouth at bedtime., Disp: , Rfl:  .  loratadine (CLARITIN) 10 MG tablet, Take 10 mg by mouth at bedtime as needed (seasonal allergies)., Disp: , Rfl:  .  losartan (COZAAR) 100 MG tablet, TAKE 1 TABLET BY MOUTH EVERY DAY. NEEDS OV FOR FURTHER REFILLS., Disp: 30 tablet, Rfl: 11 .  Multiple Vitamin (MULTIVITAMIN WITH MINERALS) TABS tablet, Take 1 tablet by mouth daily., Disp: , Rfl:  .  naproxen (NAPROSYN) 500 MG tablet, Take 1 tablet (500 mg total) by mouth 2 (two) times daily with a meal., Disp: 30 tablet, Rfl: 11 .  niacin 500 MG tablet, Take 500 mg by mouth at bedtime., Disp: , Rfl:  .  PRESCRIPTION MEDICATION, at bedtime. CPAP, Disp: , Rfl:  .  vitamin E 400 UNIT capsule, Take 400 Units by mouth at bedtime. , Disp: , Rfl:   Review of Systems  Constitutional: Negative.   Respiratory: Negative.   Cardiovascular: Negative.   Endocrine: Negative.   Musculoskeletal: Positive for myalgias. Negative for arthralgias, back pain, gait problem, joint swelling, neck pain and neck stiffness.  Skin: Positive for rash. Negative for color change, pallor and wound.  Neurological: Negative for dizziness, light-headedness and headaches.  Psychiatric/Behavioral: Negative.     Social History    Tobacco Use  . Smoking status: Never Smoker  . Smokeless tobacco: Never Used  Substance Use Topics  . Alcohol use: No   Objective:   BP (!) 152/96 (BP Location: Right Arm, Patient Position: Sitting, Cuff Size: Large)   Pulse 74   Temp 98.5 F (36.9 C) (Oral)   Wt 224 lb (101.6 kg)   SpO2 95%   BMI 34.06 kg/m  Vitals:   05/05/18 1029  BP: (!) 152/96  Pulse: 74  Temp: 98.5 F (36.9 C)  TempSrc: Oral  SpO2: 95%  Weight: 224 lb (101.6 kg)     Physical Exam  Constitutional: He is oriented to person, place, and time. He appears well-developed and well-nourished.  HENT:  Head: Normocephalic and atraumatic.  Eyes: Conjunctivae are normal.  Neck: No thyromegaly present.  Cardiovascular: Normal rate, regular rhythm and normal heart sounds.  Pulmonary/Chest: Effort normal and breath sounds normal.  Abdominal: Soft.  Musculoskeletal: He exhibits no edema.  Neurological: He is alert and oriented to person, place, and time.  Skin: Skin is warm and dry. Rash noted.  Shingles rash of med thorax.  Psychiatric: He has a normal mood and affect. His behavior is normal. Judgment and thought content normal.        Assessment & Plan:     Shingles/zoster Valtrex for 1 week. Discussed possible pain issues .  I have done the exam and reviewed the above chart and it is accurate to the best of my knowledge. Development worker, community has been used in this note in any air is in the dictation or transcription are unintentional.  Wilhemena Durie, MD  [place]

## 2018-05-09 ENCOUNTER — Telehealth: Payer: Self-pay

## 2018-05-09 NOTE — Telephone Encounter (Signed)
Harry Padilla called and stated he would like to reschedule his f/u appointment with Dr. Cyndia Bent due to having shingles.  His original appointment was Sept. 4th, which was then rescheduled for first week in October with chest xray.

## 2018-05-10 ENCOUNTER — Other Ambulatory Visit: Payer: Self-pay | Admitting: Family Medicine

## 2018-05-10 DIAGNOSIS — B029 Zoster without complications: Secondary | ICD-10-CM

## 2018-05-10 MED ORDER — VALACYCLOVIR HCL 1 G PO TABS: 1000.0000 mg | ORAL_TABLET | Freq: Three times a day (TID) | ORAL | 0 refills | Status: DC

## 2018-05-10 NOTE — Telephone Encounter (Signed)
Pt contacted office for refill request on the following medications:  valACYclovir (VALTREX) 1000 MG tablet  [facility]  Last Rx: 05/05/18 Pt stated he will complete the medication tomorrow and it has helped but pt is concerned that he may need a few more days of the medication. Please advise. Thanks TNP

## 2018-05-14 DIAGNOSIS — B029 Zoster without complications: Secondary | ICD-10-CM | POA: Insufficient documentation

## 2018-05-18 ENCOUNTER — Encounter: Payer: BLUE CROSS/BLUE SHIELD | Admitting: Surgery

## 2018-06-14 ENCOUNTER — Other Ambulatory Visit: Payer: Self-pay | Admitting: Surgery

## 2018-06-14 DIAGNOSIS — Z902 Acquired absence of lung [part of]: Secondary | ICD-10-CM

## 2018-06-15 ENCOUNTER — Other Ambulatory Visit: Payer: Self-pay

## 2018-06-15 ENCOUNTER — Ambulatory Visit: Payer: BLUE CROSS/BLUE SHIELD | Admitting: Surgery

## 2018-06-15 ENCOUNTER — Ambulatory Visit
Admission: RE | Admit: 2018-06-15 | Discharge: 2018-06-15 | Disposition: A | Discharge status: Home / Self Care | Payer: BLUE CROSS/BLUE SHIELD | Source: Ambulatory Visit | Attending: Surgery | Admitting: Surgery

## 2018-06-15 ENCOUNTER — Encounter: Payer: Self-pay | Admitting: Surgery

## 2018-06-15 VITALS — BP 151/94 | HR 74 | Resp 16 | Ht 68.0 in | Wt 224.0 lb

## 2018-06-15 DIAGNOSIS — Z902 Acquired absence of lung [part of]: Secondary | ICD-10-CM

## 2018-06-15 DIAGNOSIS — Z85118 Personal history of other malignant neoplasm of bronchus and lung: Secondary | ICD-10-CM | POA: Diagnosis not present

## 2018-06-15 DIAGNOSIS — J942 Hemothorax: Secondary | ICD-10-CM | POA: Diagnosis not present

## 2018-06-15 NOTE — Progress Notes (Signed)
     HPI:  The patient returns today for follow up status post right thoracotomy and right middle lobectomy on 02/04/2012 for a T1A, N0 low-grade neuroendocrine tumor. He denies any cough or sputum production. He denies hemoptysis. He had no shortness of breath. He denies any headaches. He recently had the Shingles over his left chest and is recovering from that.  Current Outpatient Medications  Medication Sig Dispense Refill  . amLODipine (NORVASC) 5 MG tablet TAKE 1 TABLET BY MOUTH EVERY DAY 90 tablet 3  . aspirin EC 81 MG tablet Take 81 mg by mouth at bedtime.    Marland Kitchen loratadine (CLARITIN) 10 MG tablet Take 10 mg by mouth at bedtime as needed (seasonal allergies).    . losartan (COZAAR) 100 MG tablet TAKE 1 TABLET BY MOUTH EVERY DAY. NEEDS OV FOR FURTHER REFILLS. 30 tablet 11  . Multiple Vitamin (MULTIVITAMIN WITH MINERALS) TABS tablet Take 1 tablet by mouth daily.    . niacin 500 MG tablet Take 500 mg by mouth at bedtime.    Marland Kitchen PRESCRIPTION MEDICATION at bedtime. CPAP    . vitamin E 400 UNIT capsule Take 400 Units by mouth at bedtime.      No current facility-administered medications for this visit.      Physical Exam: BP (!) 151/94 (BP Location: Right Arm, Patient Position: Sitting, Cuff Size: Large)   Pulse 74   Resp 16   Ht 5\' 8"  (1.727 m)   Wt 224 lb (101.6 kg)   SpO2 97% Comment: RA  BMI 34.06 kg/m  He looks well.  There is no cervical or supraclavicular adenopathy.  The right chest incision is well-healed. There are no skin lesions or subcutaneous nodules.  Lung exam is clear.  Cardiac exam shows a regular rate and rhythm with normal heart sounds.    Diagnostic Tests:  CLINICAL DATA:  History of lung cancer with right lobectomy in 2013. No current chest complaints.  EXAM: CHEST - 2 VIEW  COMPARISON:  Radiographs 05/19/2017 and CT 05/06/2016.  FINDINGS: The heart size and mediastinal contours are stable. There are stable postsurgical changes in the  right hilar region with volume loss, pleuroparenchymal scarring and right hemidiaphragm eventration. No pleural effusion, pneumothorax or pulmonary nodularity identified. The bones appear unremarkable.  IMPRESSION: Stable postsurgical changes in the right hemithorax. No acute cardiopulmonary process.   Electronically Signed   By: Richardean Sale M.D.   On: 06/15/2018 10:01   Impression:  He continues to do well overall with no evidence of recurrent tumor 6 years after resection. He should have a yearly CXR.  Plan:  I will plan to see him back in one year with a CXR. He will follow up with Dr. Rosanna Randy otherwise.     Gaye Pollack, MD [facility] [phone]

## 2018-06-16 ENCOUNTER — Encounter: Payer: Self-pay | Admitting: Surgery

## 2018-10-27 ENCOUNTER — Other Ambulatory Visit: Payer: Self-pay

## 2018-10-27 ENCOUNTER — Ambulatory Visit (INDEPENDENT_AMBULATORY_CARE_PROVIDER_SITE_OTHER): Payer: BLUE CROSS/BLUE SHIELD | Admitting: Family Medicine

## 2018-10-27 ENCOUNTER — Encounter: Payer: Self-pay | Admitting: Family Medicine

## 2018-10-27 VITALS — BP 152/88 | HR 62 | Temp 98.6°F | Ht 68.0 in | Wt 225.6 lb

## 2018-10-27 DIAGNOSIS — Z85118 Personal history of other malignant neoplasm of bronchus and lung: Secondary | ICD-10-CM

## 2018-10-27 DIAGNOSIS — J0140 Acute pansinusitis, unspecified: Secondary | ICD-10-CM

## 2018-10-27 DIAGNOSIS — R05 Cough: Secondary | ICD-10-CM

## 2018-10-27 DIAGNOSIS — R059 Cough, unspecified: Secondary | ICD-10-CM

## 2018-10-27 MED ORDER — AZITHROMYCIN 250 MG PO TABS: ORAL_TABLET | ORAL | 0 refills | Status: DC

## 2018-10-27 MED ORDER — HYDROCOD POLST-CPM POLST ER 10-8 MG/5ML PO SUER: 5.0000 mL | Freq: Two times a day (BID) | ORAL | 0 refills | Status: DC | PRN

## 2018-10-27 NOTE — Progress Notes (Signed)
Patient: Harry Padilla Male    DOB: [birth year]   63 y.o.   MRN: [id number] Visit Date: 10/27/2018  Today's Provider: Wilhemena Durie, MD   Chief Complaint  Patient presents with  . Cough    2 weeks with yellow mucus    Subjective:     HPI Pt reports having a cough for 2 weeks with congestion and yellow mucus.  Low grade fever of 99.0.  Pt has been using OTC cough suppressants but only helped mildly.He is not improving. He has h/o lung cancer.    Allergies  Allergen Reactions  . Penicillins Rash     As child     Current Outpatient Medications:  .  amLODipine (NORVASC) 5 MG tablet, TAKE 1 TABLET BY MOUTH EVERY DAY, Disp: 90 tablet, Rfl: 3 .  aspirin EC 81 MG tablet, Take 81 mg by mouth at bedtime., Disp: , Rfl:  .  loratadine (CLARITIN) 10 MG tablet, Take 10 mg by mouth at bedtime as needed (seasonal allergies)., Disp: , Rfl:  .  losartan (COZAAR) 100 MG tablet, TAKE 1 TABLET BY MOUTH EVERY DAY. NEEDS OV FOR FURTHER REFILLS., Disp: 30 tablet, Rfl: 11 .  Multiple Vitamin (MULTIVITAMIN WITH MINERALS) TABS tablet, Take 1 tablet by mouth daily., Disp: , Rfl:  .  niacin 500 MG tablet, Take 500 mg by mouth at bedtime., Disp: , Rfl:  .  PRESCRIPTION MEDICATION, at bedtime. CPAP, Disp: , Rfl:  .  vitamin E 400 UNIT capsule, Take 400 Units by mouth at bedtime. , Disp: , Rfl:   Review of Systems  Constitutional: Negative.   HENT: Negative.   Eyes: Negative.   Respiratory: Positive for cough and shortness of breath.   Cardiovascular: Negative.   Gastrointestinal: Negative.   Endocrine: Negative.   Genitourinary: Negative.   Musculoskeletal: Negative.   Skin: Negative.   Allergic/Immunologic: Negative.   Neurological: Negative.   Hematological: Negative.   Psychiatric/Behavioral: Negative.     Social History   Tobacco Use  . Smoking status: Never Smoker  . Smokeless tobacco: Never Used  Substance Use Topics  . Alcohol use: No      Objective:   BP (!)  152/88 (BP Location: Left Arm, Patient Position: Sitting, Cuff Size: Normal)   Pulse 62   Temp 98.6 F (37 C) (Oral)   Ht 5\' 8"  (1.727 m)   Wt 225 lb 9.6 oz (102.3 kg)   SpO2 98%   BMI 34.30 kg/m  Vitals:   10/27/18 1535  BP: (!) 152/88  Pulse: 62  Temp: 98.6 F (37 C)  TempSrc: Oral  SpO2: 98%  Weight: 225 lb 9.6 oz (102.3 kg)  Height: 5\' 8"  (1.727 m)     Physical Exam Constitutional:      Appearance: He is well-developed.  HENT:     Head: Normocephalic and atraumatic.     Right Ear: Tympanic membrane and external ear normal.     Left Ear: Tympanic membrane and external ear normal.     Nose: Nose normal.     Mouth/Throat:     Pharynx: Oropharynx is clear.  Eyes:     General: No scleral icterus.    Conjunctiva/sclera: Conjunctivae normal.  Neck:     Thyroid: No thyromegaly.  Cardiovascular:     Rate and Rhythm: Normal rate and regular rhythm.     Heart sounds: Normal heart sounds.  Pulmonary:     Effort: Pulmonary effort is normal.  Breath sounds: Normal breath sounds.  Abdominal:     Palpations: Abdomen is soft.  Skin:    General: Skin is warm and dry.  Neurological:     General: No focal deficit present.     Mental Status: He is alert and oriented to person, place, and time.  Psychiatric:        Mood and Affect: Mood normal.        Behavior: Behavior normal.        Thought Content: Thought content normal.        Judgment: Judgment normal.         Assessment & Plan    1. Cough Tussionex.  2. Subacute pansinusitis zpak  3. H/O: lung cancer No CXR yet.    I have done the exam and reviewed the above chart and it is accurate to the best of my knowledge. Development worker, community has been used in this note in any air is in the dictation or transcription are unintentional.  Wilhemena Durie, MD  [place]

## 2019-01-17 DIAGNOSIS — M545 Low back pain: Secondary | ICD-10-CM | POA: Diagnosis not present

## 2019-01-17 DIAGNOSIS — M9901 Segmental and somatic dysfunction of cervical region: Secondary | ICD-10-CM | POA: Diagnosis not present

## 2019-01-17 DIAGNOSIS — M9904 Segmental and somatic dysfunction of sacral region: Secondary | ICD-10-CM | POA: Diagnosis not present

## 2019-01-17 DIAGNOSIS — M7918 Myalgia, other site: Secondary | ICD-10-CM | POA: Diagnosis not present

## 2019-01-17 DIAGNOSIS — M542 Cervicalgia: Secondary | ICD-10-CM | POA: Diagnosis not present

## 2019-01-17 DIAGNOSIS — M9903 Segmental and somatic dysfunction of lumbar region: Secondary | ICD-10-CM | POA: Diagnosis not present

## 2019-02-14 DIAGNOSIS — M545 Low back pain: Secondary | ICD-10-CM | POA: Diagnosis not present

## 2019-02-14 DIAGNOSIS — M9901 Segmental and somatic dysfunction of cervical region: Secondary | ICD-10-CM | POA: Diagnosis not present

## 2019-02-14 DIAGNOSIS — M542 Cervicalgia: Secondary | ICD-10-CM | POA: Diagnosis not present

## 2019-02-14 DIAGNOSIS — M7918 Myalgia, other site: Secondary | ICD-10-CM | POA: Diagnosis not present

## 2019-02-14 DIAGNOSIS — M9904 Segmental and somatic dysfunction of sacral region: Secondary | ICD-10-CM | POA: Diagnosis not present

## 2019-02-14 DIAGNOSIS — M9903 Segmental and somatic dysfunction of lumbar region: Secondary | ICD-10-CM | POA: Diagnosis not present

## 2019-03-02 ENCOUNTER — Other Ambulatory Visit: Payer: Self-pay | Admitting: Family Medicine

## 2019-03-07 DIAGNOSIS — R69 Illness, unspecified: Secondary | ICD-10-CM | POA: Diagnosis not present

## 2019-03-21 DIAGNOSIS — M9904 Segmental and somatic dysfunction of sacral region: Secondary | ICD-10-CM | POA: Diagnosis not present

## 2019-03-21 DIAGNOSIS — M545 Low back pain: Secondary | ICD-10-CM | POA: Diagnosis not present

## 2019-03-21 DIAGNOSIS — M542 Cervicalgia: Secondary | ICD-10-CM | POA: Diagnosis not present

## 2019-03-21 DIAGNOSIS — M9903 Segmental and somatic dysfunction of lumbar region: Secondary | ICD-10-CM | POA: Diagnosis not present

## 2019-03-21 DIAGNOSIS — M7918 Myalgia, other site: Secondary | ICD-10-CM | POA: Diagnosis not present

## 2019-03-21 DIAGNOSIS — M9901 Segmental and somatic dysfunction of cervical region: Secondary | ICD-10-CM | POA: Diagnosis not present

## 2019-04-03 ENCOUNTER — Other Ambulatory Visit: Payer: Self-pay | Admitting: Family Medicine

## 2019-04-18 DIAGNOSIS — M9901 Segmental and somatic dysfunction of cervical region: Secondary | ICD-10-CM | POA: Diagnosis not present

## 2019-04-18 DIAGNOSIS — M542 Cervicalgia: Secondary | ICD-10-CM | POA: Diagnosis not present

## 2019-04-18 DIAGNOSIS — M9903 Segmental and somatic dysfunction of lumbar region: Secondary | ICD-10-CM | POA: Diagnosis not present

## 2019-04-18 DIAGNOSIS — M545 Low back pain: Secondary | ICD-10-CM | POA: Diagnosis not present

## 2019-04-18 DIAGNOSIS — M7918 Myalgia, other site: Secondary | ICD-10-CM | POA: Diagnosis not present

## 2019-04-18 DIAGNOSIS — M9904 Segmental and somatic dysfunction of sacral region: Secondary | ICD-10-CM | POA: Diagnosis not present

## 2019-05-15 DIAGNOSIS — R69 Illness, unspecified: Secondary | ICD-10-CM | POA: Diagnosis not present

## 2019-05-16 DIAGNOSIS — M9901 Segmental and somatic dysfunction of cervical region: Secondary | ICD-10-CM | POA: Diagnosis not present

## 2019-05-16 DIAGNOSIS — M9904 Segmental and somatic dysfunction of sacral region: Secondary | ICD-10-CM | POA: Diagnosis not present

## 2019-05-16 DIAGNOSIS — M7918 Myalgia, other site: Secondary | ICD-10-CM | POA: Diagnosis not present

## 2019-05-16 DIAGNOSIS — M542 Cervicalgia: Secondary | ICD-10-CM | POA: Diagnosis not present

## 2019-05-16 DIAGNOSIS — R69 Illness, unspecified: Secondary | ICD-10-CM | POA: Diagnosis not present

## 2019-05-16 DIAGNOSIS — M545 Low back pain: Secondary | ICD-10-CM | POA: Diagnosis not present

## 2019-05-16 DIAGNOSIS — M9903 Segmental and somatic dysfunction of lumbar region: Secondary | ICD-10-CM | POA: Diagnosis not present

## 2019-05-17 ENCOUNTER — Ambulatory Visit (INDEPENDENT_AMBULATORY_CARE_PROVIDER_SITE_OTHER): Payer: Medicare HMO | Admitting: Family Medicine

## 2019-05-17 ENCOUNTER — Encounter: Payer: Self-pay | Admitting: Family Medicine

## 2019-05-17 ENCOUNTER — Other Ambulatory Visit: Payer: Self-pay

## 2019-05-17 VITALS — BP 132/78 | HR 68 | Temp 98.3°F | Resp 16 | Ht 68.0 in | Wt 225.0 lb

## 2019-05-17 DIAGNOSIS — Z1211 Encounter for screening for malignant neoplasm of colon: Secondary | ICD-10-CM

## 2019-05-17 DIAGNOSIS — Z0000 Encounter for general adult medical examination without abnormal findings: Secondary | ICD-10-CM | POA: Diagnosis not present

## 2019-05-17 DIAGNOSIS — G4733 Obstructive sleep apnea (adult) (pediatric): Secondary | ICD-10-CM

## 2019-05-17 NOTE — Progress Notes (Signed)
Patient: Harry Padilla, Male    DOB: 1956-03-03, 63 y.o.   MRN: [id number] Visit Date: 05/17/2019  Today's Provider: Wilhemena Durie, MD   Chief Complaint  Patient presents with  . Annual Exam   Subjective:     Welcome to Medicare Visit Harry Padilla is a 63 y.o. male who presents today for health maintenance and complete physical. He feels well. He reports exercising not regularly, but he does stay active . He reports he is sleeping well. He says his CPAP machine is broken at home--taped with duct tape. He has h/o esophageal stricture and at times feels like dry meat gets caught in his throat.   Review of Systems  Constitutional: Negative.   HENT: Negative.   Eyes: Negative.   Respiratory: Negative.   Cardiovascular: Negative.   Endocrine: Negative.   Musculoskeletal: Negative for arthralgias, back pain, gait problem, joint swelling, neck pain and neck stiffness.       Stiffness and probable trigger finger of left ring PIP area.  Skin: Negative for color change, pallor and wound.  Allergic/Immunologic: Negative.   Neurological: Negative for dizziness, light-headedness and headaches.  Psychiatric/Behavioral: Negative.     Social History      He  reports that he has never smoked. He has never used smokeless tobacco. He reports that he does not drink alcohol or use drugs.       Social History   Socioeconomic History  . Marital status: Married    Spouse name: Not on file  . Number of children: 2  . Years of education: Not on file  . Highest education level: Not on file  Occupational History  . Occupation: Theme park manager  . Occupation: Former Patent attorney    Comment: previous asbestos exposure  Social Needs  . Financial resource strain: Not on file  . Food insecurity    Worry: Not on file    Inability: Not on file  . Transportation needs    Medical: Not on file    Non-medical: Not on file  Tobacco Use  . Smoking status: Never Smoker  . Smokeless  tobacco: Never Used  Substance and Sexual Activity  . Alcohol use: No  . Drug use: No  . Sexual activity: Not on file  Lifestyle  . Physical activity    Days per week: Not on file    Minutes per session: Not on file  . Stress: Not on file  Relationships  . Social Herbalist on phone: Not on file    Gets together: Not on file    Attends religious service: Not on file    Active member of club or organization: Not on file    Attends meetings of clubs or organizations: Not on file    Relationship status: Not on file  Other Topics Concern  . Not on file  Social History Narrative  . Not on file    Past Medical History:  Diagnosis Date  . BPH (benign prostatic hyperplasia)   . Cancer ([place]) 2013   carcenoid - lung  . Enthesopathy   . Esophagus disorder 02/10/08   stretched at [facility]  . Hypercholesterolemia   . Hypertension   . Obesity   . OSA (obstructive sleep apnea)    cpap     last study 10 yrs ago  . Viral pneumonia      Patient Active Problem List   Diagnosis Date Noted  . Shingles 05/14/2018  .  Benign fibroma of prostate 08/26/2016  . Enthesopathy 08/26/2016  . Thrombosed hemorrhoids 08/26/2016  . Hypercholesteremia 08/26/2016  . Obstructive apnea 08/26/2016  . Surgery, elective 08/07/2015  . Carcinoid tumor 11/08/2013  . S/P lobectomy of lung 11/08/2013  . BP (high blood pressure) 01/13/2012    Past Surgical History:  Procedure Laterality Date  . CATARACT EXTRACTION W/PHACO Left 11/25/2015   Procedure: CATARACT EXTRACTION PHACO AND INTRAOCULAR LENS PLACEMENT (IOC);  Surgeon: Ronnell Freshwater, MD;  Location: [place];  Service: Ophthalmology;  Laterality: Left;  CPAP  . GAS INSERTION Left 08/07/2015   Procedure: INSERTION OF GAS-[id number];  Surgeon: Hurman Horn, MD;  Location: [place];  Service: Ophthalmology;  Laterality: Left;  . LASIK Bilateral 2002  . LUNG CANCER SURGERY    . PARS PLANA VITRECTOMY Left 08/07/2015   Procedure: PARS  PLANA VITRECTOMY WITH 25 GAUGE ;  Surgeon: Hurman Horn, MD;  Location: [place];  Service: Ophthalmology;  Laterality: Left;  . PHOTOCOAGULATION WITH LASER Left 08/07/2015   Procedure: PHOTOCOAGULATION WITH LASER-ENDOLASER;  Surgeon: Hurman Horn, MD;  Location: [place];  Service: Ophthalmology;  Laterality: Left;  Marland Kitchen VIDEO BRONCHOSCOPY  01/26/2012   Procedure: VIDEO BRONCHOSCOPY WITHOUT FLUORO;  Surgeon: Tanda Rockers, MD;  Location: [facility];  Service: Cardiopulmonary;  Laterality: Bilateral;  . VIDEO BRONCHOSCOPY  02/04/2012   Procedure: VIDEO BRONCHOSCOPY;  Surgeon: Gaye Pollack, MD;  Location: [facility] OR;  Service: Thoracic;  Laterality: N/A;    Family History        Family Status  Relation Name Status  . Mother  (Not Specified)        His family history includes Heart disease in his mother.      Allergies  Allergen Reactions  . Penicillins Rash     As child     Current Outpatient Medications:  .  amLODipine (NORVASC) 5 MG tablet, TAKE 1 TABLET BY MOUTH EVERY DAY, Disp: 90 tablet, Rfl: 3 .  aspirin EC 81 MG tablet, Take 81 mg by mouth at bedtime., Disp: , Rfl:  .  loratadine (CLARITIN) 10 MG tablet, Take 10 mg by mouth at bedtime as needed (seasonal allergies)., Disp: , Rfl:  .  losartan (COZAAR) 100 MG tablet, TAKE 1 TABLET BY MOUTH EVERY DAY. NEEDS OV FOR FURTHER REFILLS., Disp: 90 tablet, Rfl: 3 .  Multiple Vitamin (MULTIVITAMIN WITH MINERALS) TABS tablet, Take 1 tablet by mouth daily., Disp: , Rfl:  .  niacin 500 MG tablet, Take 500 mg by mouth at bedtime., Disp: , Rfl:  .  PRESCRIPTION MEDICATION, at bedtime. CPAP, Disp: , Rfl:  .  vitamin E 400 UNIT capsule, Take 400 Units by mouth at bedtime. , Disp: , Rfl:  .  azithromycin (ZITHROMAX) 250 MG tablet, 2 po day 1 then 1 po days 2-5 (Patient not taking: Reported on 05/17/2019), Disp: 6 tablet, Rfl: 0 .  chlorpheniramine-HYDROcodone (TUSSIONEX PENNKINETIC ER) 10-8 MG/5ML SUER, Take 5 mLs by mouth every 12 (twelve) hours as  needed for cough., Disp: 140 mL, Rfl: 0   Patient Care Team: Jerrol Banana., MD as PCP - General (Unknown Physician Specialty)    Objective:    Vitals: BP 132/78   Pulse 68   Temp 98.3 F (36.8 C)   Resp 16   Ht 5\' 8"  (1.727 m)   Wt 225 lb (102.1 kg)   SpO2 99%   BMI 34.21 kg/m    Vitals:   05/17/19 1511  BP: 132/78  Pulse: 68  Resp: 16  Temp: 98.3 F (36.8 C)  SpO2: 99%  Weight: 225 lb (102.1 kg)  Height: 5\' 8"  (1.727 m)     Physical Exam Vitals signs reviewed.  Constitutional:      Appearance: He is well-developed.  HENT:     Head: Normocephalic and atraumatic.     Right Ear: Tympanic membrane and external ear normal.     Left Ear: Tympanic membrane and external ear normal.     Nose: Nose normal.     Mouth/Throat:     Pharynx: Oropharynx is clear.  Eyes:     General: No scleral icterus.    Conjunctiva/sclera: Conjunctivae normal.  Neck:     Thyroid: No thyromegaly.  Cardiovascular:     Rate and Rhythm: Normal rate and regular rhythm.     Heart sounds: Normal heart sounds.  Pulmonary:     Effort: Pulmonary effort is normal.     Breath sounds: Normal breath sounds.  Abdominal:     Palpations: Abdomen is soft.  Musculoskeletal:     Comments: Trigger finger left ring finger.  Skin:    General: Skin is warm and dry.  Neurological:     General: No focal deficit present.     Mental Status: He is alert and oriented to person, place, and time.  Psychiatric:        Mood and Affect: Mood normal.        Behavior: Behavior normal.        Thought Content: Thought content normal.        Judgment: Judgment normal.      Depression Screen PHQ 2/9 Scores 05/17/2019 10/27/2018 10/12/2017 08/26/2016  PHQ - 2 Score 0 0 0 0       Assessment & Plan:     Welcome to Medicare  Exercise Activities and Dietary recommendations Goals   None     Immunization History  Administered Date(s) Administered  . Influenza Split 07/11/2007, 08/29/2012  .  Influenza,inj,Quad PF,6+ Mos 06/15/2013, 08/26/2016  . Tdap 07/11/2007    Health Maintenance  Topic Date Due  . Hepatitis C Screening  Dec 29, 1955  . HIV Screening  01/04/1969  . COLONOSCOPY  01/05/2004  . TETANUS/TDAP  07/10/2017  . PNA vac Low Risk Adult (1 of 2 - PCV13) 01/05/2019  . INFLUENZA VACCINE  04/15/2019     Discussed health benefits of physical activity, and encouraged him to engage in regular exercise appropriate for his age and condition. . 1. Welcome to Medicare preventive visit RTC 1 month. - EKG 12-Lead  2. Colon cancer screening  - Ambulatory referral to Gastroenterology  3. Obstructive apnea Pt needs new CPAP. 4.h/o Esophogeal spasm   I, Rachelle L. Presley, CMA, am acting as a Education administrator for [employer]. Rosanna Randy, [place]   Wilhemena Durie, MD  [facility]

## 2019-05-24 ENCOUNTER — Other Ambulatory Visit: Payer: Self-pay | Admitting: Surgery

## 2019-05-24 DIAGNOSIS — Z902 Acquired absence of lung [part of]: Secondary | ICD-10-CM

## 2019-05-31 DIAGNOSIS — R69 Illness, unspecified: Secondary | ICD-10-CM | POA: Diagnosis not present

## 2019-06-15 ENCOUNTER — Ambulatory Visit (INDEPENDENT_AMBULATORY_CARE_PROVIDER_SITE_OTHER): Payer: Medicare HMO | Admitting: Family Medicine

## 2019-06-15 ENCOUNTER — Other Ambulatory Visit: Payer: Self-pay

## 2019-06-15 VITALS — BP 122/70 | HR 76 | Temp 98.6°F | Resp 16 | Ht 68.0 in | Wt 228.0 lb

## 2019-06-15 DIAGNOSIS — Z125 Encounter for screening for malignant neoplasm of prostate: Secondary | ICD-10-CM

## 2019-06-15 DIAGNOSIS — E7800 Pure hypercholesterolemia, unspecified: Secondary | ICD-10-CM | POA: Diagnosis not present

## 2019-06-15 DIAGNOSIS — G4733 Obstructive sleep apnea (adult) (pediatric): Secondary | ICD-10-CM

## 2019-06-15 NOTE — Progress Notes (Signed)
Patient: Harry Padilla Male    DOB: 25-Apr-1956   63 y.o.   MRN: [id number] Visit Date: 06/15/2019  Today's Provider: Wilhemena Durie, MD   Chief Complaint  Patient presents with  . Hypertension    also needs labs   Subjective:   HPI  Hypertension, follow-up:  BP Readings from Last 3 Encounters:  06/15/19 122/70  05/17/19 132/78  10/27/18 (!) 152/88    He was last seen for hypertension 1 months ago.  BP at that visit was 132 /78. Management since that visit includes no changes. He reports good compliance with treatment. He is not having side effects.   He is adherent to low salt diet.   Outside blood pressures are checked occasionally. He is experiencing none.  Patient denies exertional chest pressure/discomfort and lower extremity edema    Weight trend: stable Wt Readings from Last 3 Encounters:  06/15/19 228 lb (103.4 kg)  05/17/19 225 lb (102.1 kg)  10/27/18 225 lb 9.6 oz (102.3 kg)    Current diet: well balanced   Allergies  Allergen Reactions  . Penicillins Rash     As child     Current Outpatient Medications:  .  amLODipine (NORVASC) 5 MG tablet, TAKE 1 TABLET BY MOUTH EVERY DAY, Disp: 90 tablet, Rfl: 3 .  aspirin EC 81 MG tablet, Take 81 mg by mouth at bedtime., Disp: , Rfl:  .  azithromycin (ZITHROMAX) 250 MG tablet, 2 po day 1 then 1 po days 2-5 (Patient not taking: Reported on 05/17/2019), Disp: 6 tablet, Rfl: 0 .  chlorpheniramine-HYDROcodone (TUSSIONEX PENNKINETIC ER) 10-8 MG/5ML SUER, Take 5 mLs by mouth every 12 (twelve) hours as needed for cough., Disp: 140 mL, Rfl: 0 .  loratadine (CLARITIN) 10 MG tablet, Take 10 mg by mouth at bedtime as needed (seasonal allergies)., Disp: , Rfl:  .  losartan (COZAAR) 100 MG tablet, TAKE 1 TABLET BY MOUTH EVERY DAY. NEEDS OV FOR FURTHER REFILLS., Disp: 90 tablet, Rfl: 3 .  Multiple Vitamin (MULTIVITAMIN WITH MINERALS) TABS tablet, Take 1 tablet by mouth daily., Disp: , Rfl:  .  niacin 500 MG  tablet, Take 500 mg by mouth at bedtime., Disp: , Rfl:  .  PRESCRIPTION MEDICATION, at bedtime. CPAP, Disp: , Rfl:  .  vitamin E 400 UNIT capsule, Take 400 Units by mouth at bedtime. , Disp: , Rfl:   Review of Systems  Constitutional: Negative.   HENT: Negative.   Eyes: Negative.   Respiratory: Negative.   Cardiovascular: Negative.   Endocrine: Negative.   Musculoskeletal: Negative for arthralgias, back pain, gait problem, joint swelling, neck pain and neck stiffness.  Skin: Negative for color change, pallor and wound.  Allergic/Immunologic: Negative.   Neurological: Negative for dizziness, light-headedness and headaches.  Psychiatric/Behavioral: Negative.     Social History   Tobacco Use  . Smoking status: Never Smoker  . Smokeless tobacco: Never Used  Substance Use Topics  . Alcohol use: No      Objective:   BP 122/70   Pulse 76   Temp 98.6 F (37 C)   Resp 16   Ht 5\' 8"  (1.727 m)   Wt 228 lb (103.4 kg)   SpO2 99%   BMI 34.67 kg/m  Vitals:   06/15/19 0814  BP: 122/70  Pulse: 76  Resp: 16  Temp: 98.6 F (37 C)  SpO2: 99%  Weight: 228 lb (103.4 kg)  Height: 5\' 8"  (1.727 m)  Body mass index  is 34.67 kg/m.   Physical Exam Vitals signs reviewed.  Constitutional:      Appearance: He is well-developed.  HENT:     Head: Normocephalic and atraumatic.     Right Ear: Tympanic membrane and external ear normal.     Left Ear: Tympanic membrane and external ear normal.     Nose: Nose normal.     Mouth/Throat:     Pharynx: Oropharynx is clear.  Eyes:     General: No scleral icterus.    Conjunctiva/sclera: Conjunctivae normal.  Neck:     Thyroid: No thyromegaly.  Cardiovascular:     Rate and Rhythm: Normal rate and regular rhythm.     Heart sounds: Normal heart sounds.  Pulmonary:     Effort: Pulmonary effort is normal.     Breath sounds: Normal breath sounds.  Abdominal:     Palpations: Abdomen is soft.  Skin:    General: Skin is warm and dry.   Neurological:     General: No focal deficit present.     Mental Status: He is alert and oriented to person, place, and time.  Psychiatric:        Mood and Affect: Mood normal.        Behavior: Behavior normal.        Thought Content: Thought content normal.        Judgment: Judgment normal.      No results found for any visits on 06/15/19.     Assessment & Plan    1. Hypercholesteremia  - Comprehensive metabolic panel - Lipid panel - TSH  2. Obstructive apnea Uses CPAP nightly.  He says he needs a new machine, his is broken. - CBC with Differential/Platelet  3. Prostate cancer screening  - PSA     Wilhemena Durie, MD  [facility]

## 2019-06-16 DIAGNOSIS — R1310 Dysphagia, unspecified: Secondary | ICD-10-CM | POA: Diagnosis not present

## 2019-06-16 DIAGNOSIS — Z1211 Encounter for screening for malignant neoplasm of colon: Secondary | ICD-10-CM | POA: Diagnosis not present

## 2019-06-16 LAB — COMPREHENSIVE METABOLIC PANEL
ALT: 22 IU/L (ref 0–44)
AST: 21 IU/L (ref 0–40)
Albumin/Globulin Ratio: 1.4 (ref 1.2–2.2)
Albumin: 4.4 g/dL (ref 3.8–4.8)
Alkaline Phosphatase: 91 IU/L (ref 39–117)
BUN/Creatinine Ratio: 16 (ref 10–24)
BUN: 16 mg/dL (ref 8–27)
Bilirubin Total: 0.3 mg/dL (ref 0.0–1.2)
CO2: 23 mmol/L (ref 20–29)
Calcium: 9.7 mg/dL (ref 8.6–10.2)
Chloride: 105 mmol/L (ref 96–106)
Creatinine, Ser: 1.01 mg/dL (ref 0.76–1.27)
GFR calc Af Amer: 90 mL/min/{1.73_m2} (ref >59)
GFR calc non Af Amer: 78 mL/min/{1.73_m2} (ref >59)
Globulin, Total: 3.1 g/dL (ref 1.5–4.5)
Glucose: 111 mg/dL — ABNORMAL HIGH (ref 65–99)
Potassium: 4.8 mmol/L (ref 3.5–5.2)
Sodium: 142 mmol/L (ref 134–144)
Total Protein: 7.5 g/dL (ref 6.0–8.5)

## 2019-06-16 LAB — CBC WITH DIFFERENTIAL/PLATELET
Basophils Absolute: 0.1 10*3/uL (ref 0.0–0.2)
Basos: 1 %
EOS (ABSOLUTE): 0.3 10*3/uL (ref 0.0–0.4)
Eos: 5 %
Hematocrit: 43.6 % (ref 37.5–51.0)
Hemoglobin: 14.9 g/dL (ref 13.0–17.7)
Immature Grans (Abs): 0 10*3/uL (ref 0.0–0.1)
Immature Granulocytes: 0 %
Lymphocytes Absolute: 2.1 10*3/uL (ref 0.7–3.1)
Lymphs: 28 %
MCH: 31.1 pg (ref 26.6–33.0)
MCHC: 34.2 g/dL (ref 31.5–35.7)
MCV: 91 fL (ref 79–97)
Monocytes Absolute: 0.7 10*3/uL (ref 0.1–0.9)
Monocytes: 10 %
Neutrophils Absolute: 4.1 10*3/uL (ref 1.4–7.0)
Neutrophils: 56 %
Platelets: 265 10*3/uL (ref 150–450)
RBC: 4.79 x10E6/uL (ref 4.14–5.80)
RDW: 13.5 % (ref 11.6–15.4)
WBC: 7.4 10*3/uL (ref 3.4–10.8)

## 2019-06-16 LAB — LIPID PANEL
Chol/HDL Ratio: 6.1 ratio — ABNORMAL HIGH (ref 0.0–5.0)
Cholesterol, Total: 214 mg/dL — ABNORMAL HIGH (ref 100–199)
HDL: 35 mg/dL — ABNORMAL LOW (ref >39)
LDL Chol Calc (NIH): 91 mg/dL (ref 0–99)
Triglycerides: 537 mg/dL — ABNORMAL HIGH (ref 0–149)
VLDL Cholesterol Cal: 88 mg/dL — ABNORMAL HIGH (ref 5–40)

## 2019-06-16 LAB — PSA: Prostate Specific Ag, Serum: 2.2 ng/mL (ref 0.0–4.0)

## 2019-06-16 LAB — TSH: TSH: 3.22 u[IU]/mL (ref 0.450–4.500)

## 2019-06-19 ENCOUNTER — Telehealth: Payer: Self-pay

## 2019-06-19 NOTE — Telephone Encounter (Signed)
Patient advised as below. Patient verbalizes understanding and is in agreement with treatment plan.  

## 2019-06-19 NOTE — Telephone Encounter (Signed)
-----   Message from Jerrol Banana., MD sent at 06/18/2019  9:27 AM EDT ----- Labs show prediabetes and high lipids--work on iet and exercise.

## 2019-06-20 DIAGNOSIS — M7918 Myalgia, other site: Secondary | ICD-10-CM | POA: Diagnosis not present

## 2019-06-20 DIAGNOSIS — M9901 Segmental and somatic dysfunction of cervical region: Secondary | ICD-10-CM | POA: Diagnosis not present

## 2019-06-20 DIAGNOSIS — M545 Low back pain: Secondary | ICD-10-CM | POA: Diagnosis not present

## 2019-06-20 DIAGNOSIS — M542 Cervicalgia: Secondary | ICD-10-CM | POA: Diagnosis not present

## 2019-06-20 DIAGNOSIS — M9903 Segmental and somatic dysfunction of lumbar region: Secondary | ICD-10-CM | POA: Diagnosis not present

## 2019-06-20 DIAGNOSIS — M9904 Segmental and somatic dysfunction of sacral region: Secondary | ICD-10-CM | POA: Diagnosis not present

## 2019-06-28 ENCOUNTER — Ambulatory Visit: Payer: Medicare HMO | Admitting: Surgery

## 2019-07-05 ENCOUNTER — Encounter: Payer: Self-pay | Admitting: Surgery

## 2019-07-05 ENCOUNTER — Other Ambulatory Visit: Payer: Self-pay

## 2019-07-05 ENCOUNTER — Ambulatory Visit
Admission: RE | Admit: 2019-07-05 | Discharge: 2019-07-05 | Disposition: A | Discharge status: Home / Self Care | Payer: Medicare HMO | Source: Ambulatory Visit | Attending: Surgery | Admitting: Surgery

## 2019-07-05 ENCOUNTER — Ambulatory Visit: Payer: Medicare HMO | Admitting: Surgery

## 2019-07-05 VITALS — BP 149/76 | HR 80 | Temp 97.8°F | Resp 20 | Ht 68.0 in | Wt 225.0 lb

## 2019-07-05 DIAGNOSIS — J984 Other disorders of lung: Secondary | ICD-10-CM | POA: Diagnosis not present

## 2019-07-05 DIAGNOSIS — Z85118 Personal history of other malignant neoplasm of bronchus and lung: Secondary | ICD-10-CM

## 2019-07-05 DIAGNOSIS — Z902 Acquired absence of lung [part of]: Secondary | ICD-10-CM

## 2019-07-05 NOTE — Progress Notes (Signed)
     HPI:  The patient returns today for follow up status post right thoracotomy and right middle lobectomy on 02/04/2012 for a T1A, N0 low-grade neuroendocrine tumor. He denies any cough or sputum production. He denies hemoptysis. He had no shortness of breath. He denies any headaches.  Current Outpatient Medications  Medication Sig Dispense Refill  . amLODipine (NORVASC) 5 MG tablet TAKE 1 TABLET BY MOUTH EVERY DAY 90 tablet 3  . aspirin EC 81 MG tablet Take 81 mg by mouth at bedtime.    . chlorpheniramine-HYDROcodone (TUSSIONEX PENNKINETIC ER) 10-8 MG/5ML SUER Take 5 mLs by mouth every 12 (twelve) hours as needed for cough. 140 mL 0  . loratadine (CLARITIN) 10 MG tablet Take 10 mg by mouth at bedtime as needed (seasonal allergies).    . losartan (COZAAR) 100 MG tablet TAKE 1 TABLET BY MOUTH EVERY DAY. NEEDS OV FOR FURTHER REFILLS. 90 tablet 3  . Multiple Vitamin (MULTIVITAMIN WITH MINERALS) TABS tablet Take 1 tablet by mouth daily.    . niacin 500 MG tablet Take 500 mg by mouth at bedtime.    Marland Kitchen PRESCRIPTION MEDICATION at bedtime. CPAP    . vitamin E 400 UNIT capsule Take 400 Units by mouth at bedtime.     Marland Kitchen azithromycin (ZITHROMAX) 250 MG tablet 2 po day 1 then 1 po days 2-5 (Patient not taking: Reported on 07/05/2019) 6 tablet 0   No current facility-administered medications for this visit.      Physical Exam: BP (!) 149/76   Pulse 80   Temp 97.8 F (36.6 C) (Skin)   Resp 20   Ht 5\' 8"  (1.727 m)   Wt 225 lb (102.1 kg)   SpO2 95% Comment: RA  BMI 34.21 kg/m  He looks well. There is no cervical or supraclavicular adenopathy. Cardiac exam shows a regular rate and rhythm with normal heart sounds. Lung exam is clear.  Diagnostic Tests:  CLINICAL DATA:  Status post lobectomy.  EXAM: CHEST - 2 VIEW  COMPARISON:  June 15, 2018.  FINDINGS: Stable cardiomediastinal silhouette. No pneumothorax or pleural effusion is noted. Left lung is clear. Stable scarring and other  postsurgical changes are noted in right lung base. Elevated right hemidiaphragm is noted. No acute abnormality is noted. Bony thorax is unremarkable.  IMPRESSION: Stable scarring and other postsurgical changes are noted in right lung base. No acute abnormality is noted.   Electronically Signed   By: Marijo Conception M.D.   On: 07/05/2019 16:07   Impression:  He continues to do well without evidence of recurrent tumor 7 years following resection.  He has not had a flu shot this year and I encouraged him to do that for November.  I reviewed the chest x-ray with him and answered his questions.  I think he should have a yearly chest x-ray for surveillance.  Plan:  I will plan to see him back in 1 year chest x-ray.  He will continue to follow-up with Dr. Rosanna Randy for his general medical care.     Gaye Pollack, MD [facility] [phone]

## 2019-07-06 DIAGNOSIS — H35372 Puckering of macula, left eye: Secondary | ICD-10-CM | POA: Diagnosis not present

## 2019-07-06 DIAGNOSIS — H35352 Cystoid macular degeneration, left eye: Secondary | ICD-10-CM | POA: Diagnosis not present

## 2019-07-06 DIAGNOSIS — H33032 Retinal detachment with giant retinal tear, left eye: Secondary | ICD-10-CM | POA: Diagnosis not present

## 2019-07-06 DIAGNOSIS — H2511 Age-related nuclear cataract, right eye: Secondary | ICD-10-CM | POA: Diagnosis not present

## 2019-07-07 ENCOUNTER — Other Ambulatory Visit: Payer: Self-pay

## 2019-07-07 ENCOUNTER — Other Ambulatory Visit
Admission: RE | Admit: 2019-07-07 | Discharge: 2019-07-07 | Disposition: A | Discharge status: Home / Self Care | Payer: Medicare HMO | Source: Ambulatory Visit | Attending: General Surgery | Admitting: General Surgery

## 2019-07-07 DIAGNOSIS — Z20828 Contact with and (suspected) exposure to other viral communicable diseases: Secondary | ICD-10-CM | POA: Insufficient documentation

## 2019-07-07 DIAGNOSIS — Z01812 Encounter for preprocedural laboratory examination: Secondary | ICD-10-CM | POA: Diagnosis not present

## 2019-07-08 LAB — SARS CORONAVIRUS 2 (TAT 6-24 HRS): SARS Coronavirus 2: NEGATIVE

## 2019-07-10 DIAGNOSIS — L821 Other seborrheic keratosis: Secondary | ICD-10-CM | POA: Diagnosis not present

## 2019-07-10 DIAGNOSIS — L820 Inflamed seborrheic keratosis: Secondary | ICD-10-CM | POA: Diagnosis not present

## 2019-07-11 ENCOUNTER — Encounter: Payer: Self-pay | Admitting: *Deleted

## 2019-07-12 ENCOUNTER — Ambulatory Visit: Payer: Medicare HMO | Admitting: Anesthesiology

## 2019-07-12 ENCOUNTER — Encounter
Admission: RE | Disposition: A | Discharge status: Home / Self Care | Payer: Self-pay | Source: Home / Self Care | Attending: General Surgery

## 2019-07-12 ENCOUNTER — Other Ambulatory Visit: Payer: Self-pay

## 2019-07-12 ENCOUNTER — Ambulatory Visit
Admission: RE | Admit: 2019-07-12 | Discharge: 2019-07-12 | Disposition: A | Discharge status: Home / Self Care | Payer: Medicare HMO | Attending: General Surgery | Admitting: General Surgery

## 2019-07-12 DIAGNOSIS — K5790 Diverticulosis of intestine, part unspecified, without perforation or abscess without bleeding: Secondary | ICD-10-CM | POA: Diagnosis not present

## 2019-07-12 DIAGNOSIS — Z7982 Long term (current) use of aspirin: Secondary | ICD-10-CM | POA: Insufficient documentation

## 2019-07-12 DIAGNOSIS — K3189 Other diseases of stomach and duodenum: Secondary | ICD-10-CM | POA: Diagnosis not present

## 2019-07-12 DIAGNOSIS — Z1211 Encounter for screening for malignant neoplasm of colon: Secondary | ICD-10-CM | POA: Insufficient documentation

## 2019-07-12 DIAGNOSIS — R1310 Dysphagia, unspecified: Secondary | ICD-10-CM | POA: Diagnosis not present

## 2019-07-12 DIAGNOSIS — I10 Essential (primary) hypertension: Secondary | ICD-10-CM | POA: Diagnosis not present

## 2019-07-12 DIAGNOSIS — K5730 Diverticulosis of large intestine without perforation or abscess without bleeding: Secondary | ICD-10-CM | POA: Insufficient documentation

## 2019-07-12 DIAGNOSIS — Z6834 Body mass index (BMI) 34.0-34.9, adult: Secondary | ICD-10-CM | POA: Insufficient documentation

## 2019-07-12 DIAGNOSIS — E669 Obesity, unspecified: Secondary | ICD-10-CM | POA: Diagnosis not present

## 2019-07-12 DIAGNOSIS — Z79899 Other long term (current) drug therapy: Secondary | ICD-10-CM | POA: Insufficient documentation

## 2019-07-12 DIAGNOSIS — D123 Benign neoplasm of transverse colon: Secondary | ICD-10-CM | POA: Diagnosis not present

## 2019-07-12 DIAGNOSIS — G4733 Obstructive sleep apnea (adult) (pediatric): Secondary | ICD-10-CM | POA: Insufficient documentation

## 2019-07-12 DIAGNOSIS — C189 Malignant neoplasm of colon, unspecified: Secondary | ICD-10-CM | POA: Diagnosis not present

## 2019-07-12 DIAGNOSIS — Z880 Allergy status to penicillin: Secondary | ICD-10-CM | POA: Diagnosis not present

## 2019-07-12 DIAGNOSIS — K635 Polyp of colon: Secondary | ICD-10-CM | POA: Diagnosis not present

## 2019-07-12 HISTORY — PX: ESOPHAGOGASTRODUODENOSCOPY (EGD) WITH PROPOFOL: SHX5813

## 2019-07-12 HISTORY — PX: COLONOSCOPY WITH PROPOFOL: SHX5780

## 2019-07-12 SURGERY — COLONOSCOPY WITH PROPOFOL
Anesthesia: General

## 2019-07-12 MED ORDER — SODIUM CHLORIDE 0.9 % IV SOLN
INTRAVENOUS | Status: DC
  Administered 2019-07-12: 1000 mL via INTRAVENOUS

## 2019-07-12 MED ORDER — GLYCOPYRROLATE 0.2 MG/ML IJ SOLN
INTRAMUSCULAR | Status: AC
  Filled 2019-07-12: qty 1

## 2019-07-12 MED ORDER — LIDOCAINE HCL (PF) 2 % IJ SOLN
INTRAMUSCULAR | Status: AC
  Filled 2019-07-12: qty 20

## 2019-07-12 MED ORDER — PROPOFOL 10 MG/ML IV BOLUS
INTRAVENOUS | Status: DC | PRN
  Administered 2019-07-12: 30 mg via INTRAVENOUS
  Administered 2019-07-12: 70 mg via INTRAVENOUS

## 2019-07-12 MED ORDER — PROPOFOL 500 MG/50ML IV EMUL
INTRAVENOUS | Status: AC
  Filled 2019-07-12: qty 100

## 2019-07-12 MED ORDER — LIDOCAINE HCL (PF) 2 % IJ SOLN
INTRAMUSCULAR | Status: AC
  Filled 2019-07-12: qty 10

## 2019-07-12 MED ORDER — PROPOFOL 500 MG/50ML IV EMUL
INTRAVENOUS | Status: DC | PRN
  Administered 2019-07-12: 140 ug/kg/min via INTRAVENOUS

## 2019-07-12 MED ORDER — GLYCOPYRROLATE 0.2 MG/ML IJ SOLN
INTRAMUSCULAR | Status: DC | PRN
  Administered 2019-07-12: 0.2 mg via INTRAVENOUS

## 2019-07-12 MED ORDER — LIDOCAINE HCL (CARDIAC) PF 100 MG/5ML IV SOSY
PREFILLED_SYRINGE | INTRAVENOUS | Status: DC | PRN
  Administered 2019-07-12: 60 mg via INTRAVENOUS

## 2019-07-12 NOTE — Op Note (Signed)
[facility] Patient Name: Harry Padilla Procedure Date: 07/12/2019 9:20 AM MRN: [id number] Account #: [id number] Date of Birth: 09-27-1955 Admit Type: Outpatient Age: 63 Room: [facility] ENDO ROOM 1 Gender: Male Note Status: Finalized Procedure:            Colonoscopy Indications:          Screening for colorectal malignant neoplasm Providers:            Robert Bellow, MD Referring MD:         Janine Ores. Rosanna Randy, MD (Referring MD) Medicines:            Monitored Anesthesia Care Procedure:            Pre-Anesthesia Assessment:                       - Prior to the procedure, a History and Physical was                        performed, and patient medications, allergies and                        sensitivities were reviewed. The patient's tolerance of                        previous anesthesia was reviewed.                       - The risks and benefits of the procedure and the                        sedation options and risks were discussed with the                        patient. All questions were answered and informed                        consent was obtained.                       After obtaining informed consent, the colonoscope was                        passed under direct vision. Throughout the procedure,                        the patient's blood pressure, pulse, and oxygen                        saturations were monitored continuously. The                        Colonoscope was introduced through the anus and                        advanced to the the cecum, identified by appendiceal                        orifice and ileocecal valve. The colonoscopy was                        performed without difficulty. The patient  tolerated the                        procedure well. The quality of the bowel preparation                        was excellent. Findings:      A few medium-mouthed diverticula were found in the sigmoid colon.      The  retroflexed view of the distal rectum and anal verge was normal and       showed no anal or rectal abnormalities.      A 4 mm polyp was found in the transverse colon. The polyp was sessile.       Biopsies were taken with a cold forceps for histology. Impression:           - Diverticulosis in the sigmoid colon.                       - The distal rectum and anal verge are normal on                        retroflexion view.                       - One 4 mm, non-bleeding polyp in the transverse colon,                        removed with a cold biopsy forceps. Resected and                        retrieved. Recommendation:       - Repeat colonoscopy in 10 years for screening purposes.                       - Telephone endoscopist for pathology results in 1 week.                       - Repeat colonoscopy date to be determined after                        pending pathology results are reviewed for surveillance                        based on pathology results. Procedure Code(s):    --- Professional ---                       [phone], Colonoscopy, flexible; with biopsy, single or                        multiple Diagnosis Code(s):    --- Professional ---                       K57.30, Diverticulosis of large intestine without                        perforation or abscess without bleeding                       K63.5, Polyp of colon  Z12.11, Encounter for screening for malignant neoplasm                        of colon CPT copyright 2019 American Medical Association. All rights reserved. The codes documented in this report are preliminary and upon coder review may  be revised to meet current compliance requirements. Robert Bellow, MD 07/12/2019 10:06:05 AM This report has been signed electronically. Number of Addenda: 0 Note Initiated On: 07/12/2019 9:20 AM Scope Withdrawal Time: 0 hours 8 minutes 32 seconds  Total Procedure Duration: 0 hours 14 minutes 28 seconds   Estimated Blood Loss: Estimated blood loss: none.      [facility]

## 2019-07-12 NOTE — Transfer of Care (Signed)
Immediate Anesthesia Transfer of Care Note  Patient: Harry Padilla  Procedure(s) Performed: COLONOSCOPY WITH PROPOFOL (N/A ) ESOPHAGOGASTRODUODENOSCOPY (EGD) WITH PROPOFOL (N/A )  Patient Location: PACU  Anesthesia Type:General  Level of Consciousness: sedated  Airway & Oxygen Therapy: Patient Spontanous Breathing and Patient connected to nasal cannula oxygen  Post-op Assessment: Report given to RN and Post -op Vital signs reviewed and stable  Post vital signs: Reviewed and stable  Last Vitals:  Vitals Value Taken Time  BP 136/91 07/12/19 1006  Temp 36.4 C 07/12/19 1006  Pulse 85 07/12/19 1006  Resp 10 07/12/19 1006  SpO2 97 % 07/12/19 1006  Vitals shown include unvalidated device data.  Last Pain:  Vitals:   07/12/19 1006  TempSrc:   PainSc: 0-No pain         Complications: No apparent anesthesia complications

## 2019-07-12 NOTE — H&P (Signed)
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  No other dysphagia foods.  Past history of endoscopy with dilatation.  Candidate for screening colonoscopy.  The patient reports tolerating his prep well.    Medications Prior to Admission  Medication Sig Dispense Refill Last Dose  . amLODipine (NORVASC) 5 MG tablet TAKE 1 TABLET BY MOUTH EVERY DAY 90 tablet 3 07/11/2019 at Unknown time  . aspirin EC 81 MG tablet Take 81 mg by mouth at bedtime.   07/11/2019 at Unknown time  . azithromycin (ZITHROMAX) 250 MG tablet 2 po day 1 then 1 po days 2-5 6 tablet 0 Past Week at Unknown time  . chlorpheniramine-HYDROcodone (TUSSIONEX PENNKINETIC ER) 10-8 MG/5ML SUER Take 5 mLs by mouth every 12 (twelve) hours as needed for cough. 140 mL 0 Past Week at Unknown time  . loratadine (CLARITIN) 10 MG tablet Take 10 mg by mouth at bedtime as needed (seasonal allergies).   Past Week at Unknown time  . losartan (COZAAR) 100 MG tablet TAKE 1 TABLET BY MOUTH EVERY DAY. NEEDS OV FOR FURTHER REFILLS. 90 tablet 3 07/11/2019 at Unknown time  . Multiple Vitamin (MULTIVITAMIN WITH MINERALS) TABS tablet Take 1 tablet by mouth daily.   Past Week at Unknown time  . niacin 500 MG tablet Take 500 mg by mouth at bedtime.   Past Week at Unknown time  . vitamin E 400 UNIT capsule Take 400 Units by mouth at bedtime.    Past Week at Unknown time  . PRESCRIPTION MEDICATION at bedtime. CPAP      Allergies  Allergen Reactions  . Penicillins Rash     As child   Past Medical History:  Diagnosis Date  . BPH (benign prostatic hyperplasia)   . Cancer ([place]) 2013   carcenoid - lung  . Enthesopathy   . Esophagus disorder 02/10/08   stretched at [facility]  . Hypercholesterolemia   . Hypertension   . Obesity   . OSA (obstructive sleep apnea)    cpap     last study 10 yrs ago  . Viral pneumonia    Past Surgical History:   Procedure Laterality Date  . CATARACT EXTRACTION W/PHACO Left 11/25/2015   Procedure: CATARACT EXTRACTION PHACO AND INTRAOCULAR LENS PLACEMENT (IOC);  Surgeon: Ronnell Freshwater, MD;  Location: [place];  Service: Ophthalmology;  Laterality: Left;  CPAP  . EYE SURGERY    . GAS INSERTION Left 08/07/2015   Procedure: INSERTION OF GAS-C3F8;  Surgeon: Hurman Horn, MD;  Location: [place];  Service: Ophthalmology;  Laterality: Left;  . LASIK Bilateral 2002  . LUNG CANCER SURGERY    . PARS PLANA VITRECTOMY Left 08/07/2015   Procedure: PARS PLANA VITRECTOMY WITH 25 GAUGE ;  Surgeon: Hurman Horn, MD;  Location: [place];  Service: Ophthalmology;  Laterality: Left;  . PHOTOCOAGULATION WITH LASER Left 08/07/2015   Procedure: PHOTOCOAGULATION WITH LASER-ENDOLASER;  Surgeon: Hurman Horn, MD;  Location: [place];  Service: Ophthalmology;  Laterality: Left;  Marland Kitchen VIDEO BRONCHOSCOPY  01/26/2012   Procedure: VIDEO BRONCHOSCOPY WITHOUT FLUORO;  Surgeon: Tanda Rockers, MD;  [facility];  Service: Cardiopulmonary;  Laterality: Bilateral;  . VIDEO BRONCHOSCOPY  02/04/2012   Procedure: VIDEO BRONCHOSCOPY;  Surgeon: Gaye Pollack, MD;  [facility]: [facility] OR;  Service: Thoracic;  Laterality: N/A;   Social History   Socioeconomic History  . Marital status: Married  Spouse name: Not on file  . Number of children: 2  . Years of education: Not on file  . Highest education level: Not on file  Occupational History  . Occupation: Theme park manager  . Occupation: Former Patent attorney    Comment: previous asbestos exposure  Social Needs  . Financial resource strain: Not on file  . Food insecurity    Worry: Not on file    Inability: Not on file  . Transportation needs    Medical: Not on file    Non-medical: Not on file  Tobacco Use  . Smoking status: Never Smoker  . Smokeless tobacco: Never Used  Substance and Sexual Activity  . Alcohol use: No  . Drug use: No  . Sexual activity: Not on file   Lifestyle  . Physical activity    Days per week: Not on file    Minutes per session: Not on file  . Stress: Not on file  Relationships  . Social Herbalist on phone: Not on file    Gets together: Not on file    Attends religious service: Not on file    Active member of club or organization: Not on file    Attends meetings of clubs or organizations: Not on file    Relationship status: Not on file  . Intimate partner violence    Fear of current or ex partner: Not on file    Emotionally abused: Not on file    Physically abused: Not on file    Forced sexual activity: Not on file  Other Topics Concern  . Not on file  Social History Narrative  . Not on file   Social History   Social History Narrative  . Not on file     ROS: Negative.     PE: HEENT: Negative. Lungs: Clear. Cardio: RR.  Assessment/Plan:  Proceed with planned endoscopy  Forest Gleason [facility] 07/12/2019    .

## 2019-07-12 NOTE — Op Note (Signed)
[facility] Gastroenterology Patient Name: Harry Padilla Procedure Date: 07/12/2019 9:21 AM MRN: [id number] Account #: [id number] Date of Birth: [birth year] Admit Type: Outpatient Age: 63 Room: [facility] ENDO ROOM 1 Gender: Male Note Status: Finalized Procedure:            Upper GI endoscopy Indications:          Dysphagia Providers:            Robert Bellow, MD Referring MD:         Janine Ores. Rosanna Randy, MD (Referring MD) Medicines:            Monitored Anesthesia Care Complications:        No immediate complications. Procedure:            Pre-Anesthesia Assessment:                       - Prior to the procedure, a History and Physical was                        performed, and patient medications, allergies and                        sensitivities were reviewed. The patient's tolerance of                        previous anesthesia was reviewed.                       - The risks and benefits of the procedure and the                        sedation options and risks were discussed with the                        patient. All questions were answered and informed                        consent was obtained.                       After obtaining informed consent, the endoscope was                        passed under direct vision. Throughout the procedure,                        the patient's blood pressure, pulse, and oxygen                        saturations were monitored continuously. The Endoscope                        was introduced through the mouth, and advanced to the                        second part of duodenum. Findings:      The esophagus was normal. This distended normally withut evidence of       stricture or spasm.. GEJ easily passed. No evidence of a stricture to       dilate.      Diffuse moderately erythematous mucosa  without bleeding was found at the       gastroesophageal junction. Biopsies were taken with a cold forceps for        histology.      The stomach was normal. Impression:           - Normal esophagus.                       - Erythematous mucosa in the gastroesophageal junction.                        Biopsied.                       - Normal stomach. Recommendation:       - Perform a colonoscopy today. Procedure Code(s):    --- Professional ---                       [phone], Esophagogastroduodenoscopy, flexible, transoral;                        with biopsy, single or multiple Diagnosis Code(s):    --- Professional ---                       K31.89, Other diseases of stomach and duodenum                       R13.10, Dysphagia, unspecified CPT copyright 2019 American Medical Association. All rights reserved. The codes documented in this report are preliminary and upon coder review may  be revised to meet current compliance requirements. Robert Bellow, MD 07/12/2019 9:44:42 AM This report has been signed electronically. Number of Addenda: 0 Note Initiated On: 07/12/2019 9:21 AM Estimated Blood Loss: Estimated blood loss: none.      [employer] [facility]

## 2019-07-12 NOTE — Anesthesia Preprocedure Evaluation (Signed)
Anesthesia Evaluation  Patient identified by MRN, date of birth, ID band Patient awake    Reviewed: Allergy & Precautions, NPO status , Patient's Chart, lab work & pertinent test results  History of Anesthesia Complications Negative for: history of anesthetic complications  Airway Mallampati: II       Dental   Pulmonary sleep apnea and Continuous Positive Airway Pressure Ventilation , neg COPD, Not current smoker,  S/P carcinoid tumor resection RUL          Cardiovascular hypertension, Pt. on medications (-) Past MI and (-) CHF (-) dysrhythmias (-) Valvular Problems/Murmurs     Neuro/Psych neg Seizures    GI/Hepatic Neg liver ROS, neg GERD  ,  Endo/Other  neg diabetes  Renal/GU negative Renal ROS     Musculoskeletal   Abdominal   Peds  Hematology   Anesthesia Other Findings   Reproductive/Obstetrics                             Anesthesia Physical Anesthesia Plan  ASA: II  Anesthesia Plan: General   Post-op Pain Management:    Induction: Intravenous  PONV Risk Score and Plan: 2 and Propofol infusion and TIVA  Airway Management Planned: Nasal Cannula  Additional Equipment:   Intra-op Plan:   Post-operative Plan:   Informed Consent: I have reviewed the patients History and Physical, chart, labs and discussed the procedure including the risks, benefits and alternatives for the proposed anesthesia with the patient or authorized representative who has indicated his/her understanding and acceptance.       Plan Discussed with:   Anesthesia Plan Comments:         Anesthesia Quick Evaluation

## 2019-07-12 NOTE — Anesthesia Postprocedure Evaluation (Signed)
Anesthesia Post Note  Patient: Harry Padilla  Procedure(s) Performed: COLONOSCOPY WITH PROPOFOL (N/A ) ESOPHAGOGASTRODUODENOSCOPY (EGD) WITH PROPOFOL (N/A )  Patient location during evaluation: Endoscopy Anesthesia Type: General Level of consciousness: awake and alert Pain management: pain level controlled Vital Signs Assessment: post-procedure vital signs reviewed and stable Respiratory status: spontaneous breathing and respiratory function stable Cardiovascular status: stable Anesthetic complications: no     Last Vitals:  Vitals:   07/12/19 0911 07/12/19 1006  BP: (!) 149/87 (!) 136/91  Pulse: 69 93  Resp: 20 13  Temp: (!) 35.7 C 36.4 C  SpO2: 98% 97%    Last Pain:  Vitals:   07/12/19 1006  TempSrc:   PainSc: 0-No pain                 Averey Trompeter K

## 2019-07-12 NOTE — Anesthesia Post-op Follow-up Note (Signed)
Anesthesia QCDR form completed.        

## 2019-07-13 ENCOUNTER — Encounter: Payer: Self-pay | Admitting: General Surgery

## 2019-07-13 LAB — SURGICAL PATHOLOGY

## 2019-07-14 DIAGNOSIS — R69 Illness, unspecified: Secondary | ICD-10-CM | POA: Diagnosis not present

## 2019-07-25 DIAGNOSIS — M542 Cervicalgia: Secondary | ICD-10-CM | POA: Diagnosis not present

## 2019-07-25 DIAGNOSIS — M545 Low back pain: Secondary | ICD-10-CM | POA: Diagnosis not present

## 2019-07-25 DIAGNOSIS — M9903 Segmental and somatic dysfunction of lumbar region: Secondary | ICD-10-CM | POA: Diagnosis not present

## 2019-07-25 DIAGNOSIS — M9901 Segmental and somatic dysfunction of cervical region: Secondary | ICD-10-CM | POA: Diagnosis not present

## 2019-07-25 DIAGNOSIS — M7918 Myalgia, other site: Secondary | ICD-10-CM | POA: Diagnosis not present

## 2019-07-25 DIAGNOSIS — M9904 Segmental and somatic dysfunction of sacral region: Secondary | ICD-10-CM | POA: Diagnosis not present

## 2019-08-15 DIAGNOSIS — M545 Low back pain: Secondary | ICD-10-CM | POA: Diagnosis not present

## 2019-08-15 DIAGNOSIS — M9904 Segmental and somatic dysfunction of sacral region: Secondary | ICD-10-CM | POA: Diagnosis not present

## 2019-08-15 DIAGNOSIS — M9901 Segmental and somatic dysfunction of cervical region: Secondary | ICD-10-CM | POA: Diagnosis not present

## 2019-08-15 DIAGNOSIS — M542 Cervicalgia: Secondary | ICD-10-CM | POA: Diagnosis not present

## 2019-08-15 DIAGNOSIS — M7918 Myalgia, other site: Secondary | ICD-10-CM | POA: Diagnosis not present

## 2019-08-15 DIAGNOSIS — M9903 Segmental and somatic dysfunction of lumbar region: Secondary | ICD-10-CM | POA: Diagnosis not present

## 2019-08-21 DIAGNOSIS — Z872 Personal history of diseases of the skin and subcutaneous tissue: Secondary | ICD-10-CM | POA: Diagnosis not present

## 2019-08-21 DIAGNOSIS — L821 Other seborrheic keratosis: Secondary | ICD-10-CM | POA: Diagnosis not present

## 2019-09-22 DIAGNOSIS — M7918 Myalgia, other site: Secondary | ICD-10-CM | POA: Diagnosis not present

## 2019-09-22 DIAGNOSIS — M542 Cervicalgia: Secondary | ICD-10-CM | POA: Diagnosis not present

## 2019-09-22 DIAGNOSIS — M545 Low back pain: Secondary | ICD-10-CM | POA: Diagnosis not present

## 2019-09-22 DIAGNOSIS — M9901 Segmental and somatic dysfunction of cervical region: Secondary | ICD-10-CM | POA: Diagnosis not present

## 2019-09-22 DIAGNOSIS — M9904 Segmental and somatic dysfunction of sacral region: Secondary | ICD-10-CM | POA: Diagnosis not present

## 2019-09-22 DIAGNOSIS — M9903 Segmental and somatic dysfunction of lumbar region: Secondary | ICD-10-CM | POA: Diagnosis not present

## 2019-09-25 DIAGNOSIS — R69 Illness, unspecified: Secondary | ICD-10-CM | POA: Diagnosis not present

## 2019-10-13 ENCOUNTER — Telehealth: Payer: Self-pay

## 2019-10-13 DIAGNOSIS — G4733 Obstructive sleep apnea (adult) (pediatric): Secondary | ICD-10-CM

## 2019-10-13 NOTE — Telephone Encounter (Signed)
Copied from [place] [phone]. Topic: General - Other >> Oct 13, 2019  1:32 PM Celene Kras wrote: Reason for CRM: Pt called stating that he is needing a new sleep apnea machine. Pt states that PCP told him to call in at the first of the year to get a new sleep study done. Please advise .

## 2019-10-16 NOTE — Telephone Encounter (Signed)
Please advise? Okay to order sleep study? There is no record of a previous sleep study in patient's chart.

## 2019-10-17 DIAGNOSIS — M545 Low back pain: Secondary | ICD-10-CM | POA: Diagnosis not present

## 2019-10-17 DIAGNOSIS — M542 Cervicalgia: Secondary | ICD-10-CM | POA: Diagnosis not present

## 2019-10-17 DIAGNOSIS — M9901 Segmental and somatic dysfunction of cervical region: Secondary | ICD-10-CM | POA: Diagnosis not present

## 2019-10-17 DIAGNOSIS — M7918 Myalgia, other site: Secondary | ICD-10-CM | POA: Diagnosis not present

## 2019-10-17 DIAGNOSIS — M9903 Segmental and somatic dysfunction of lumbar region: Secondary | ICD-10-CM | POA: Diagnosis not present

## 2019-10-17 DIAGNOSIS — M9904 Segmental and somatic dysfunction of sacral region: Secondary | ICD-10-CM | POA: Diagnosis not present

## 2019-10-23 NOTE — Addendum Note (Signed)
Addended by: Julieta Bellini on: 10/23/2019 01:43 PM   Modules accepted: Orders

## 2019-10-23 NOTE — Telephone Encounter (Signed)
Please schedule sleep study. Thank you.

## 2019-10-23 NOTE — Telephone Encounter (Signed)
He has sleep study for known sleep apnea.  Thank you

## 2019-10-23 NOTE — Telephone Encounter (Signed)
Order in epic. 

## 2019-10-28 NOTE — Telephone Encounter (Signed)
Order for home sleep study faxed to [facility] 10/27/19

## 2019-10-30 NOTE — Telephone Encounter (Signed)
Noted  

## 2019-10-31 DIAGNOSIS — G4733 Obstructive sleep apnea (adult) (pediatric): Secondary | ICD-10-CM | POA: Diagnosis not present

## 2019-10-31 DIAGNOSIS — R0602 Shortness of breath: Secondary | ICD-10-CM | POA: Diagnosis not present

## 2019-10-31 LAB — PULMONARY FUNCTION TEST

## 2019-11-01 DIAGNOSIS — G4733 Obstructive sleep apnea (adult) (pediatric): Secondary | ICD-10-CM | POA: Diagnosis not present

## 2019-11-01 DIAGNOSIS — R0602 Shortness of breath: Secondary | ICD-10-CM | POA: Diagnosis not present

## 2019-11-14 DIAGNOSIS — M9903 Segmental and somatic dysfunction of lumbar region: Secondary | ICD-10-CM | POA: Diagnosis not present

## 2019-11-14 DIAGNOSIS — M9904 Segmental and somatic dysfunction of sacral region: Secondary | ICD-10-CM | POA: Diagnosis not present

## 2019-11-14 DIAGNOSIS — M9901 Segmental and somatic dysfunction of cervical region: Secondary | ICD-10-CM | POA: Diagnosis not present

## 2019-11-14 DIAGNOSIS — M545 Low back pain: Secondary | ICD-10-CM | POA: Diagnosis not present

## 2019-11-14 DIAGNOSIS — M542 Cervicalgia: Secondary | ICD-10-CM | POA: Diagnosis not present

## 2019-11-14 DIAGNOSIS — M7918 Myalgia, other site: Secondary | ICD-10-CM | POA: Diagnosis not present

## 2019-11-16 DIAGNOSIS — G4733 Obstructive sleep apnea (adult) (pediatric): Secondary | ICD-10-CM | POA: Diagnosis not present

## 2019-12-17 DIAGNOSIS — G4733 Obstructive sleep apnea (adult) (pediatric): Secondary | ICD-10-CM | POA: Diagnosis not present

## 2019-12-19 DIAGNOSIS — M542 Cervicalgia: Secondary | ICD-10-CM | POA: Diagnosis not present

## 2019-12-19 DIAGNOSIS — M545 Low back pain: Secondary | ICD-10-CM | POA: Diagnosis not present

## 2019-12-19 DIAGNOSIS — M9904 Segmental and somatic dysfunction of sacral region: Secondary | ICD-10-CM | POA: Diagnosis not present

## 2019-12-19 DIAGNOSIS — M7918 Myalgia, other site: Secondary | ICD-10-CM | POA: Diagnosis not present

## 2019-12-19 DIAGNOSIS — M9903 Segmental and somatic dysfunction of lumbar region: Secondary | ICD-10-CM | POA: Diagnosis not present

## 2019-12-19 DIAGNOSIS — M9901 Segmental and somatic dysfunction of cervical region: Secondary | ICD-10-CM | POA: Diagnosis not present

## 2019-12-28 ENCOUNTER — Ambulatory Visit (INDEPENDENT_AMBULATORY_CARE_PROVIDER_SITE_OTHER): Payer: Medicare HMO | Admitting: Ophthalmology

## 2019-12-28 ENCOUNTER — Encounter (INDEPENDENT_AMBULATORY_CARE_PROVIDER_SITE_OTHER): Payer: Self-pay | Admitting: Ophthalmology

## 2019-12-28 ENCOUNTER — Other Ambulatory Visit: Payer: Self-pay

## 2019-12-28 DIAGNOSIS — H35352 Cystoid macular degeneration, left eye: Secondary | ICD-10-CM | POA: Diagnosis not present

## 2019-12-28 DIAGNOSIS — H33032 Retinal detachment with giant retinal tear, left eye: Secondary | ICD-10-CM

## 2019-12-28 DIAGNOSIS — H35372 Puckering of macula, left eye: Secondary | ICD-10-CM

## 2019-12-28 NOTE — Progress Notes (Signed)
12/28/2019     CHIEF COMPLAINT Patient presents for Retina Follow Up   HISTORY OF PRESENT ILLNESS: Harry Padilla is a 64 y.o. male who presents to the clinic today for:   HPI    Retina Follow Up    Patient presents with  Other.  In both eyes.  Duration of 6 months.  Since onset it is stable.          Comments    6 month f.u - OCT OU Patient denies change in vision and overall has no complaints.        Last edited by Gerda Diss, [place] on 12/28/2019  8:18 AM. (History)      Referring physician: Jerrol Banana., MD [address] [place],  [place] [address]  HISTORICAL INFORMATION:   Selected notes from the MEDICAL RECORD NUMBER       CURRENT MEDICATIONS: Current Outpatient Medications (Ophthalmic Drugs)  Medication Sig  . ketorolac (ACULAR) 0.5 % ophthalmic solution Place 1 drop into the left eye 4 (four) times daily.   No current facility-administered medications for this visit. (Ophthalmic Drugs)   Current Outpatient Medications (Other)  Medication Sig  . amLODipine (NORVASC) 5 MG tablet TAKE 1 TABLET BY MOUTH EVERY DAY  . aspirin EC 81 MG tablet Take 81 mg by mouth at bedtime.  Marland Kitchen loratadine (CLARITIN) 10 MG tablet Take 10 mg by mouth at bedtime as needed (seasonal allergies).  . losartan (COZAAR) 100 MG tablet TAKE 1 TABLET BY MOUTH EVERY DAY. NEEDS OV FOR FURTHER REFILLS.  . Multiple Vitamin (MULTIVITAMIN WITH MINERALS) TABS tablet Take 1 tablet by mouth daily.  . niacin 500 MG tablet Take 500 mg by mouth at bedtime.  Marland Kitchen PRESCRIPTION MEDICATION at bedtime. CPAP  . vitamin E 400 UNIT capsule Take 400 Units by mouth at bedtime.   Marland Kitchen azithromycin (ZITHROMAX) 250 MG tablet 2 po day 1 then 1 po days 2-5 (Patient not taking: Reported on 12/28/2019)  . chlorpheniramine-HYDROcodone (TUSSIONEX PENNKINETIC ER) 10-8 MG/5ML SUER Take 5 mLs by mouth every 12 (twelve) hours as needed for cough. (Patient not taking: Reported on 12/28/2019)   No current  facility-administered medications for this visit. (Other)      REVIEW OF SYSTEMS:    ALLERGIES Allergies  Allergen Reactions  . Penicillins Rash     As child    PAST MEDICAL HISTORY Past Medical History:  Diagnosis Date  . BPH (benign prostatic hyperplasia)   . Cancer ([place]) 2013   carcenoid - lung  . Enthesopathy   . Esophagus disorder 02/10/08   stretched at [facility]  . Hypercholesterolemia   . Hypertension   . Obesity   . OSA (obstructive sleep apnea)    cpap     last study 10 yrs ago  . Viral pneumonia    Past Surgical History:  Procedure Laterality Date  . CATARACT EXTRACTION W/PHACO Left 11/25/2015   Procedure: CATARACT EXTRACTION PHACO AND INTRAOCULAR LENS PLACEMENT (IOC);  Surgeon: Ronnell Freshwater, MD;  Location: [place];  Service: Ophthalmology;  Laterality: Left;  CPAP  . COLONOSCOPY WITH PROPOFOL N/A 07/12/2019   Procedure: COLONOSCOPY WITH PROPOFOL;  Surgeon: Robert Bellow, MD;  Location: [facility] ENDOSCOPY;  Service: Endoscopy;  Laterality: N/A;  . ESOPHAGOGASTRODUODENOSCOPY (EGD) WITH PROPOFOL N/A 07/12/2019   Procedure: ESOPHAGOGASTRODUODENOSCOPY (EGD) WITH PROPOFOL;  Surgeon: Robert Bellow, MD;  Location: [facility] ENDOSCOPY;  Service: Endoscopy;  Laterality: N/A;  . EYE SURGERY    . GAS  INSERTION Left 08/07/2015   Procedure: INSERTION OF GAS-C3F8;  Surgeon: Hurman Horn, MD;  Location: [place];  Service: Ophthalmology;  Laterality: Left;  . LASIK Bilateral 2002  . LUNG CANCER SURGERY    . PARS PLANA VITRECTOMY Left 08/07/2015   Procedure: PARS PLANA VITRECTOMY WITH 25 GAUGE ;  Surgeon: Hurman Horn, MD;  Location: [place];  Service: Ophthalmology;  Laterality: Left;  . PHOTOCOAGULATION WITH LASER Left 08/07/2015   Procedure: PHOTOCOAGULATION WITH LASER-ENDOLASER;  Surgeon: Hurman Horn, MD;  Location: [place];  Service: Ophthalmology;  Laterality: Left;  Marland Kitchen VIDEO BRONCHOSCOPY  01/26/2012   Procedure: VIDEO BRONCHOSCOPY WITHOUT FLUORO;   Surgeon: Tanda Rockers, MD;  [facility];  Service: Cardiopulmonary;  Laterality: Bilateral;  . VIDEO BRONCHOSCOPY  02/04/2012   Procedure: VIDEO BRONCHOSCOPY;  Surgeon: Gaye Pollack, MD;  Location[facility] OR;  Service: Thoracic;  Laterality: N/A;    FAMILY HISTORY Family History  Problem Relation Age of Onset  . Heart disease Mother     SOCIAL HISTORY Social History   Tobacco Use  . Smoking status: Never Smoker  . Smokeless tobacco: Never Used  Substance Use Topics  . Alcohol use: No  . Drug use: No         OPHTHALMIC EXAM: Base Eye Exam    Visual Acuity (Snellen - Linear)      Right Left   Dist [place] 20/25-1 20/200+1   Dist ph [place]  20/60-1       Tonometry (Tonopen, 8:26 AM)      Right Left   Pressure 12 13       Pupils      Pupils Dark Light Shape React APD   Right PERRL 3 2 Round Brisk None   Left PERRL 3 2 Round Brisk None       Visual Fields (Counting fingers)      Left Right    Full Full       Extraocular Movement      Right Left    Full Full       Neuro/Psych    Oriented x3: Yes   Mood/Affect: Normal       Dilation    Both eyes: 1.0% Mydriacyl, 2.5% Phenylephrine @ 8:26 AM        Slit Lamp and Fundus Exam    External Exam      Right Left   External Normal Normal       Slit Lamp Exam      Right Left   Lids/Lashes Normal Normal   Conjunctiva/Sclera White and quiet White and quiet   Cornea Clear Clear   Anterior Chamber Deep and quiet Deep and quiet   Iris Round and reactive Round and reactive   Lens  Posterior chamber intraocular lens   Vitreous Normal Normal          IMAGING AND PROCEDURES  Imaging and Procedures for @TODAY @           ASSESSMENT/PLAN:  No problem-specific Assessment & Plan notes found for this encounter.      ICD-10-CM   1. Cystoid macular edema of left eye  H35.352   2. Left epiretinal membrane  H35.372   3. 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  2.  3.  Ophthalmic Meds Ordered this visit:  No orders of the defined types were placed in this encounter.      No follow-ups on  file.  There are no Patient Instructions on file for this visit.   Explained the diagnoses, plan, and follow up with the patient and they expressed understanding.  Patient expressed understanding of the importance of proper follow up care.   Clent Demark [place] Mosco M.D. Diseases & Surgery of the Retina and Vitreous Retina & Diabetic [place] @TODAY @     Abbreviations: M myopia (nearsighted); A astigmatism; H hyperopia (farsighted); P presbyopia; Mrx spectacle prescription;  CTL contact lenses; OD right eye; OS left eye; OU both eyes  XT exotropia; ET esotropia; PEK punctate epithelial keratitis; PEE punctate epithelial erosions; DES dry eye syndrome; MGD meibomian gland dysfunction; ATs artificial tears; PFAT's preservative free artificial tears; [place] nuclear sclerotic cataract; PSC posterior subcapsular cataract; ERM epi-retinal membrane; PVD posterior vitreous detachment; RD retinal detachment; DM diabetes mellitus; DR diabetic retinopathy; NPDR non-proliferative diabetic retinopathy; PDR proliferative diabetic retinopathy; CSME clinically significant macular edema; DME diabetic macular edema; dbh dot blot hemorrhages; CWS cotton wool spot; POAG primary open angle glaucoma; C/D cup-to-disc ratio; HVF humphrey visual field; GVF goldmann visual field; OCT optical coherence tomography; IOP intraocular pressure; BRVO Branch retinal vein occlusion; CRVO central retinal vein occlusion; CRAO central retinal artery occlusion; BRAO branch retinal artery occlusion; RT retinal tear; SB scleral buckle; PPV pars plana vitrectomy; VH Vitreous hemorrhage; PRP panretinal laser photocoagulation; IVK intravitreal kenalog; VMT vitreomacular traction; MH Macular hole;  NVD neovascularization of the disc; NVE neovascularization elsewhere; AREDS age related eye disease study;  ARMD age related macular degeneration; POAG primary open angle glaucoma; EBMD epithelial/anterior basement membrane dystrophy; ACIOL anterior chamber intraocular lens; IOL intraocular lens; PCIOL posterior chamber intraocular lens; Phaco/IOL phacoemulsification with intraocular lens placement; [place] photorefractive keratectomy; LASIK laser assisted in situ keratomileusis; HTN hypertension; DM diabetes mellitus; COPD chronic obstructive pulmonary disease

## 2019-12-28 NOTE — Assessment & Plan Note (Signed)
The nature of macular pucker (epiretinal membrane ERM) was discussed with the patient as well as threshold criteria for vitrectomy surgery. I explained that in rare cases another surgery is needed to actually remove a second wrinkle should it regrow.  Most often, the epiretinal membrane and underlying wrinkled internal limiting membrane are removed with the first surgery, to accomplish the goals.   If the operative eye is [place] (natural lens still present), cataract surgery is often recommended prior to Vitrectomy. This will enable the retina surgeon to have the best view during surgery and the patient to obtain optimal results in the future. Treatment options were discussed.  OS, minor no visual impact observe

## 2019-12-28 NOTE — Assessment & Plan Note (Signed)
The nature of cystoid macular edema including causes, exacerbating factors, and treatments including steroid and nonsteroidal anti-inflammatory drops, periocular injections of steroids, and intravitreal injections of steroids were reviewed. An informational brochure was offered.  The patient's questions were answered. The potential side effects of the various treatments were reviewed including the potential for intraocular pressure rise with steroid treatments.  I often use topical NSAIDS alone or in conjunction with periocular steroids to resolve this condition.   Patient instructed not to compress or "rub" on the eye.  More rarely, surgery may be considered if the condition is not improving.  OS, improved from February 2019 and stable on twice daily NSAIDs.  Will continue.

## 2020-01-16 DIAGNOSIS — G4733 Obstructive sleep apnea (adult) (pediatric): Secondary | ICD-10-CM | POA: Diagnosis not present

## 2020-01-24 DIAGNOSIS — M9903 Segmental and somatic dysfunction of lumbar region: Secondary | ICD-10-CM | POA: Diagnosis not present

## 2020-01-24 DIAGNOSIS — M9901 Segmental and somatic dysfunction of cervical region: Secondary | ICD-10-CM | POA: Diagnosis not present

## 2020-01-24 DIAGNOSIS — M545 Low back pain: Secondary | ICD-10-CM | POA: Diagnosis not present

## 2020-01-24 DIAGNOSIS — M7918 Myalgia, other site: Secondary | ICD-10-CM | POA: Diagnosis not present

## 2020-01-24 DIAGNOSIS — M9904 Segmental and somatic dysfunction of sacral region: Secondary | ICD-10-CM | POA: Diagnosis not present

## 2020-01-24 DIAGNOSIS — M542 Cervicalgia: Secondary | ICD-10-CM | POA: Diagnosis not present

## 2020-02-16 DIAGNOSIS — G4733 Obstructive sleep apnea (adult) (pediatric): Secondary | ICD-10-CM | POA: Diagnosis not present

## 2020-02-29 ENCOUNTER — Telehealth: Payer: Self-pay | Admitting: Family Medicine

## 2020-02-29 ENCOUNTER — Other Ambulatory Visit: Payer: Self-pay

## 2020-02-29 DIAGNOSIS — J302 Other seasonal allergic rhinitis: Secondary | ICD-10-CM

## 2020-02-29 MED ORDER — LORATADINE 10 MG PO TABS: 10.0000 mg | ORAL_TABLET | Freq: Every evening | ORAL | 5 refills | Status: AC | PRN

## 2020-02-29 NOTE — Telephone Encounter (Signed)
Pt called saying he needs a refill on the Losartan 100 mg.  He said he [facility] saying [facility] insurance does not cover this medication any longer.  Patient says he has about 9 days left of the Losartan.  He thinks this may be a mistake because his insurance has paid for the rx this year already.  CB# [phone] or [phone]

## 2020-03-01 MED ORDER — LOSARTAN POTASSIUM 100 MG PO TABS: ORAL_TABLET | ORAL | 3 refills | Status: AC

## 2020-03-01 NOTE — Telephone Encounter (Signed)
Noted  

## 2020-03-01 NOTE — Telephone Encounter (Signed)
Returned call to patient. Advised losartan may need a PA or we may need to change to a different medication. Sent a refill to pharmacy.

## 2020-03-01 NOTE — Telephone Encounter (Signed)
Patient called to inform the doctor that the mixup with the medication has been resolved.  He said he is all good now.  There is no need to call him unless she has to speak with him and he said thank you for taking care of it for him.

## 2020-03-17 DIAGNOSIS — G4733 Obstructive sleep apnea (adult) (pediatric): Secondary | ICD-10-CM | POA: Diagnosis not present

## 2020-03-26 ENCOUNTER — Other Ambulatory Visit: Payer: Self-pay | Admitting: Family Medicine

## 2020-03-26 NOTE — Telephone Encounter (Signed)
Requested Prescriptions  Pending Prescriptions Disp Refills  . amLODipine (NORVASC) 5 MG tablet [Pharmacy Med Name: AMLODIPINE BESYLATE 5 MG TAB] 30 tablet 1    Sig: TAKE 1 TABLET BY MOUTH EVERY DAY     Cardiovascular:  Calcium Channel Blockers Failed - 03/26/2020  1:45 AM      Failed - Last BP in normal range    BP Readings from Last 1 Encounters:  07/12/19 (!) 143/85         Failed - Valid encounter within last 6 months    Recent Outpatient Visits          9 months ago [place] Jerrol Banana., MD   10 months ago [employer] preventive visit   [facility] Jerrol Banana., MD   1 year ago Cough   [facility] Jerrol Banana., MD   1 year ago Herpes zoster without complication   [facility] Jerrol Banana., MD   1 year ago Muscle strain of chest wall, initial encounter   [facility] [place], [place], [place]      Future Appointments            In 1 month  [facility] [place], [place]   In 1 month Rosanna Randy, Retia Passe., MD [facility]           Greater than 9 months since last office visit.  Courtesy Refill of # 30 with one refill (total 60) provided to get patient to his next appointment on May 22, 2020.

## 2020-04-02 ENCOUNTER — Other Ambulatory Visit: Payer: Self-pay | Admitting: Family Medicine

## 2020-04-17 DIAGNOSIS — G4733 Obstructive sleep apnea (adult) (pediatric): Secondary | ICD-10-CM | POA: Diagnosis not present

## 2020-04-26 ENCOUNTER — Other Ambulatory Visit: Payer: Self-pay | Admitting: Family Medicine

## 2020-05-17 ENCOUNTER — Telehealth: Payer: Self-pay

## 2020-05-17 NOTE — Telephone Encounter (Signed)
Copied from [place] [phone]. Topic: General - Other >> May 17, 2020  2:41 PM Harry Padilla wrote: Patient called to inform Dr. Rosanna Randy that he and his wife Edem Tiegs moved to [place] close to [place] and want to know would PCP recommend any providers in that area that he knows of. Please advise

## 2020-05-18 DIAGNOSIS — G4733 Obstructive sleep apnea (adult) (pediatric): Secondary | ICD-10-CM | POA: Diagnosis not present

## 2020-05-21 NOTE — Telephone Encounter (Signed)
Sorry, I do not know anyone in the area.

## 2020-05-21 NOTE — Telephone Encounter (Signed)
Patient advised.

## 2020-05-22 ENCOUNTER — Encounter: Payer: Self-pay | Admitting: Family Medicine

## 2020-05-22 ENCOUNTER — Ambulatory Visit: Payer: Self-pay

## 2020-05-24 ENCOUNTER — Other Ambulatory Visit: Payer: Self-pay | Admitting: Family Medicine

## 2020-05-24 DIAGNOSIS — I10 Essential (primary) hypertension: Secondary | ICD-10-CM

## 2020-05-24 NOTE — Telephone Encounter (Signed)
Requested medication (s) are due for refill today -yes  Requested medication (s) are on the active medication list -yes  Future visit scheduled -no  Last refill: 04/26/20  Notes to clinic: Call to patient to check in and make see if ha has made appointment with new provider since move. He has not yet found provider to establish care. Patient wants to make PCP aware that he is doing great. He has had full COVID vaccine now and has not had any problems. He already misses the practice. He will also need RF on Losartan 100 mg soon- he did get 90 day supply last fill- but can tell he has less than 1 month left. Message sent for PCP review for RF and # supply to get patient to new provider.   Requested Prescriptions  Pending Prescriptions Disp Refills   amLODipine (NORVASC) 5 MG tablet [Pharmacy Med Name: AMLODIPINE BESYLATE 5 MG TAB] 30 tablet 0    Sig: TAKE 1 TABLET BY MOUTH EVERY DAY      Cardiovascular:  Calcium Channel Blockers Failed - 05/24/2020 11:30 AM      Failed - Last BP in normal range    BP Readings from Last 1 Encounters:  07/12/19 (!) 143/85          Failed - Valid encounter within last 6 months    Recent Outpatient Visits           11 months ago [facility] Jerrol Banana., MD   1 year ago [facility] to [facility] preventive visit   [facility] Jerrol Banana., MD   1 year ago Cough   [facility] Jerrol Banana., MD   2 years ago Herpes zoster without complication   [facility] Jerrol Banana., MD   2 years ago Muscle strain of chest wall, initial encounter   [facility], [place], [place]                  Requested Prescriptions  Pending Prescriptions Disp Refills   amLODipine (NORVASC) 5 MG tablet [Pharmacy Med Name: AMLODIPINE BESYLATE 5 MG TAB] 30 tablet 0    Sig: TAKE 1 TABLET BY MOUTH EVERY DAY      Cardiovascular:  Calcium Channel  Blockers Failed - 05/24/2020 11:30 AM      Failed - Last BP in normal range    BP Readings from Last 1 Encounters:  07/12/19 (!) 143/85          Failed - Valid encounter within last 6 months    Recent Outpatient Visits           11 months ago [place], Richard L Jr., MD   1 year ago [facility] to [facility] preventive visit   [facility] Jerrol Banana., MD   1 year ago Cough   [facility] Jerrol Banana., MD   2 years ago Herpes zoster without complication   [facility] Jerrol Banana., MD   2 years ago Muscle strain of chest wall, initial encounter   [place], [place]

## 2020-05-27 NOTE — Telephone Encounter (Signed)
Patient has moved. Dr. Rosanna Randy said that it is okay for me to give patient refill but he would like patient to find pcp where he now resides.

## 2020-05-30 DIAGNOSIS — E669 Obesity, unspecified: Secondary | ICD-10-CM | POA: Diagnosis not present

## 2020-05-30 DIAGNOSIS — H547 Unspecified visual loss: Secondary | ICD-10-CM | POA: Diagnosis not present

## 2020-05-30 DIAGNOSIS — Z6834 Body mass index (BMI) 34.0-34.9, adult: Secondary | ICD-10-CM | POA: Diagnosis not present

## 2020-05-30 DIAGNOSIS — I10 Essential (primary) hypertension: Secondary | ICD-10-CM | POA: Diagnosis not present

## 2020-05-30 DIAGNOSIS — Z85110 Personal history of malignant carcinoid tumor of bronchus and lung: Secondary | ICD-10-CM | POA: Diagnosis not present

## 2020-05-30 DIAGNOSIS — Z8249 Family history of ischemic heart disease and other diseases of the circulatory system: Secondary | ICD-10-CM | POA: Diagnosis not present

## 2020-05-30 DIAGNOSIS — H33319 Horseshoe tear of retina without detachment, unspecified eye: Secondary | ICD-10-CM | POA: Diagnosis not present

## 2020-05-30 DIAGNOSIS — Z7982 Long term (current) use of aspirin: Secondary | ICD-10-CM | POA: Diagnosis not present

## 2020-05-30 DIAGNOSIS — Z809 Family history of malignant neoplasm, unspecified: Secondary | ICD-10-CM | POA: Diagnosis not present

## 2020-05-30 DIAGNOSIS — Z008 Encounter for other general examination: Secondary | ICD-10-CM | POA: Diagnosis not present

## 2020-06-17 DIAGNOSIS — G4733 Obstructive sleep apnea (adult) (pediatric): Secondary | ICD-10-CM | POA: Diagnosis not present

## 2020-07-01 ENCOUNTER — Encounter (INDEPENDENT_AMBULATORY_CARE_PROVIDER_SITE_OTHER): Payer: Medicare HMO | Admitting: Ophthalmology

## 2020-07-09 ENCOUNTER — Other Ambulatory Visit: Payer: Self-pay | Admitting: Surgery

## 2020-07-09 DIAGNOSIS — Z902 Acquired absence of lung [part of]: Secondary | ICD-10-CM

## 2020-07-10 ENCOUNTER — Encounter: Payer: Self-pay | Admitting: Surgery

## 2020-07-10 ENCOUNTER — Ambulatory Visit: Payer: Medicare HMO | Admitting: Surgery

## 2020-07-10 ENCOUNTER — Ambulatory Visit
Admission: RE | Admit: 2020-07-10 | Discharge: 2020-07-10 | Disposition: A | Discharge status: Home / Self Care | Payer: Medicare HMO | Source: Ambulatory Visit | Attending: Surgery | Admitting: Surgery

## 2020-07-10 ENCOUNTER — Other Ambulatory Visit: Payer: Self-pay

## 2020-07-10 VITALS — BP 168/92 | HR 68 | Resp 20 | Ht 68.0 in | Wt 223.0 lb

## 2020-07-10 DIAGNOSIS — Z902 Acquired absence of lung [part of]: Secondary | ICD-10-CM

## 2020-07-10 DIAGNOSIS — Z85118 Personal history of other malignant neoplasm of bronchus and lung: Secondary | ICD-10-CM

## 2020-07-10 DIAGNOSIS — J984 Other disorders of lung: Secondary | ICD-10-CM | POA: Diagnosis not present

## 2020-07-10 NOTE — Progress Notes (Signed)
     HPI:  The patient returns today for follow up status post right thoracotomy and right middle lobectomy on 02/04/2012 for a T1A, N0 low-grade neuroendocrine tumor. He has had some recent cough from allergies.  He denies hemoptysis. He had no shortness of breath. He denies any headaches.   Current Outpatient Medications  Medication Sig Dispense Refill  . amLODipine (NORVASC) 5 MG tablet TAKE 1 TABLET BY MOUTH EVERY DAY 90 tablet 0  . aspirin EC 81 MG tablet Take 81 mg by mouth at bedtime.    Marland Kitchen ketorolac (ACULAR) 0.5 % ophthalmic solution Place 1 drop into the left eye 4 (four) times daily.    Marland Kitchen loratadine (CLARITIN) 10 MG tablet Take 1 tablet (10 mg total) by mouth at bedtime as needed (seasonal allergies). 30 tablet 5  . losartan (COZAAR) 100 MG tablet TAKE 1 TABLET BY MOUTH EVERY DAY. 90 tablet 3  . Multiple Vitamin (MULTIVITAMIN WITH MINERALS) TABS tablet Take 1 tablet by mouth daily.    . niacin 500 MG tablet Take 500 mg by mouth at bedtime.    Marland Kitchen PRESCRIPTION MEDICATION at bedtime. CPAP    . vitamin E 400 UNIT capsule Take 400 Units by mouth at bedtime.      No current facility-administered medications for this visit.     Physical Exam: BP (!) 168/92 (BP Location: Left Arm, Patient Position: Sitting)   Pulse 68   Resp 20   Ht 5\' 8"  (1.727 m)   Wt 223 lb (101.2 kg)   SpO2 94%   BMI 33.91 kg/m  He looks well. There is no cervical or supraclavicular adenopathy. Cardiac exam shows a regular rate and rhythm with normal heart sounds. Lung exam is clear.  Diagnostic Tests:  CLINICAL DATA:  Postoperative lobectomy  EXAM: CHEST - 2 VIEW  COMPARISON:  July 05, 2019  FINDINGS: Postoperative scarring and volume loss on the right is stable. No edema or airspace opacity. Heart size and pulmonary vascularity normal. No adenopathy. No bone lesions.  IMPRESSION: Stable postoperative scarring and volume loss on the right. No new opacity evident. Stable cardiac  silhouette. No change from 1 year prior.   Electronically Signed   By: Lowella Grip III M.D.   On: 07/10/2020 10:28   Impression:  He is now 8 years following resection without evidence of recurrent tumor.  I think he should have a follow-up chest x-ray yearly for surveillance.  He just recently moved close to [facility] and is establishing with a primary physician there.  I told him that I think he can have a chest x-ray done yearly by his new primary physician but I will be happy to see him if he wants to continue follow-up in [place].  Plan:  We tentatively scheduled a follow-up appointment for 1 year with a chest x-ray.  If he decides to follow-up in [place] then he will cancel that appointment.   Gaye Pollack, MD [facility] [phone]

## 2020-07-18 DIAGNOSIS — G4733 Obstructive sleep apnea (adult) (pediatric): Secondary | ICD-10-CM | POA: Diagnosis not present

## 2020-07-31 ENCOUNTER — Ambulatory Visit (INDEPENDENT_AMBULATORY_CARE_PROVIDER_SITE_OTHER): Payer: Medicare HMO | Admitting: Ophthalmology

## 2020-07-31 ENCOUNTER — Other Ambulatory Visit: Payer: Self-pay

## 2020-07-31 ENCOUNTER — Encounter (INDEPENDENT_AMBULATORY_CARE_PROVIDER_SITE_OTHER): Payer: Self-pay | Admitting: Ophthalmology

## 2020-07-31 DIAGNOSIS — H35371 Puckering of macula, right eye: Secondary | ICD-10-CM | POA: Diagnosis not present

## 2020-07-31 DIAGNOSIS — H33032 Retinal detachment with giant retinal tear, left eye: Secondary | ICD-10-CM

## 2020-07-31 DIAGNOSIS — H2511 Age-related nuclear cataract, right eye: Secondary | ICD-10-CM

## 2020-07-31 DIAGNOSIS — H35372 Puckering of macula, left eye: Secondary | ICD-10-CM | POA: Diagnosis not present

## 2020-07-31 DIAGNOSIS — H35352 Cystoid macular degeneration, left eye: Secondary | ICD-10-CM | POA: Diagnosis not present

## 2020-07-31 NOTE — Assessment & Plan Note (Signed)
OS improved and stable, and controlled on topical ketorolac twice daily OS

## 2020-07-31 NOTE — Assessment & Plan Note (Signed)
The nature of cataract was discussed with the patient as well as the elective nature of surgery. The patient was reassured that surgery at a later date does not put the patient at risk for a worse outcome. It was emphasized that the need for surgery is dictated by the patient's quality of life as influenced by the cataract. Patient was instructed to maintain close follow up with their general eye care doctor.  ED with no visual impact to current nuclear sclerotic cataract

## 2020-07-31 NOTE — Progress Notes (Signed)
07/31/2020     CHIEF COMPLAINT Patient presents for Retina Follow Up   HISTORY OF PRESENT ILLNESS: Harry Padilla is a 64 y.o. male who presents to the clinic today for:   HPI    Retina Follow Up    Patient presents with  Other.  In left eye.  This started 7 months ago.  Severity is mild.  Duration of 7 months.  Since onset it is stable.          Comments    6 MO F/U OU   Pt reports stable vision since last visit, no F/F, good compliance w/ gtts       Last edited by Nichola Sizer D on 07/31/2020  1:16 PM. (History)      Referring physician: Jerrol Banana., MD [address],  [place] [address]  HISTORICAL INFORMATION:   Selected notes from the MEDICAL RECORD NUMBER       CURRENT MEDICATIONS: Current Outpatient Medications (Ophthalmic Drugs)  Medication Sig  . ketorolac (ACULAR) 0.5 % ophthalmic solution Place 1 drop into the left eye 4 (four) times daily.   No current facility-administered medications for this visit. (Ophthalmic Drugs)   Current Outpatient Medications (Other)  Medication Sig  . amLODipine (NORVASC) 5 MG tablet TAKE 1 TABLET BY MOUTH EVERY DAY  . aspirin EC 81 MG tablet Take 81 mg by mouth at bedtime.  Marland Kitchen loratadine (CLARITIN) 10 MG tablet Take 1 tablet (10 mg total) by mouth at bedtime as needed (seasonal allergies).  . losartan (COZAAR) 100 MG tablet TAKE 1 TABLET BY MOUTH EVERY DAY.  . Multiple Vitamin (MULTIVITAMIN WITH MINERALS) TABS tablet Take 1 tablet by mouth daily.  . niacin 500 MG tablet Take 500 mg by mouth at bedtime.  Marland Kitchen PRESCRIPTION MEDICATION at bedtime. CPAP  . vitamin E 400 UNIT capsule Take 400 Units by mouth at bedtime.    No current facility-administered medications for this visit. (Other)      REVIEW OF SYSTEMS:    ALLERGIES Allergies  Allergen Reactions  . Penicillins Rash     As child    PAST MEDICAL HISTORY Past Medical History:  Diagnosis Date  . BPH (benign prostatic  hyperplasia)   . Cancer ([place]) 2013   carcenoid - lung  . Enthesopathy   . Esophagus disorder 02/10/08   stretched at [facility]  . Hypercholesterolemia   . Hypertension   . Obesity   . OSA (obstructive sleep apnea)    cpap     last study 10 yrs ago  . Viral pneumonia    Past Surgical History:  Procedure Laterality Date  . CATARACT EXTRACTION W/PHACO Left 11/25/2015   Procedure: CATARACT EXTRACTION PHACO AND INTRAOCULAR LENS PLACEMENT (IOC);  Surgeon: Ronnell Freshwater, MD;  Location: [place];  Service: Ophthalmology;  Laterality: Left;  CPAP  . COLONOSCOPY WITH PROPOFOL N/A 07/12/2019   Procedure: COLONOSCOPY WITH PROPOFOL;  Surgeon: Robert Bellow, MD;  Location: [facility] END[facility]COPY;  Service: Endoscopy;  Laterality: N/A;  . ESOPHAGOGASTRODUODENOSCOPY (EGD) WITH PROPOFOL N/A 07/12/2019   Procedure: ESOPHAGOGASTRODUODENOSCOPY (EGD) WITH PROPOFOL;  Surgeon: Robert Bellow, MD;  Location: [facility] END[facility]COPY;  Service: Endoscopy;  Laterality: N/A;  . EYE SURGERY    . GAS INSERTION Left 08/07/2015   Procedure: INSERTION OF GAS-[id number];  Surgeon: Hurman Horn, MD;  Location: [place];  Service: Ophthalmology;  Laterality: Left;  . LASIK Bilateral 2002  . LUNG CANCER SURGERY    . PARS  PLANA VITRECTOMY Left 08/07/2015   Procedure: PARS PLANA VITRECTOMY WITH 25 GAUGE ;  Surgeon: Hurman Horn, MD;  Location: [place];  Service: Ophthalmology;  Laterality: Left;  . PHOTOCOAGULATION WITH LASER Left 08/07/2015   Procedure: PHOTOCOAGULATION WITH LASER-ENDOLASER;  Surgeon: Hurman Horn, MD;  Location: [place];  Service: Ophthalmology;  Laterality: Left;  Marland Kitchen VIDEO BRONCHOSCOPY  01/26/2012   Procedure: VIDEO BRONCHOSCOPY WITHOUT FLUORO;  Surgeon: Tanda Rockers, MD;  Location: [facility];  Service: Cardiopulmonary;  Laterality: Bilateral;  . VIDEO BRONCHOSCOPY  02/04/2012   Procedure: VIDEO BRONCHOSCOPY;  Surgeon: Gaye Pollack, MD;  Location: [facility];  Service: Thoracic;  Laterality: N/A;     FAMILY HISTORY Family History  Problem Relation Age of Onset  . Heart disease Mother     SOCIAL HISTORY Social History   Tobacco Use  . Smoking status: Never Smoker  . Smokeless tobacco: Never Used  Vaping Use  . Vaping Use: Never used  Substance Use Topics  . Alcohol use: No  . Drug use: No         OPHTHALMIC EXAM:  Base Eye Exam    Visual Acuity (ETDRS)      Right Left   Dist [place] 20/30 -2 20/200   Dist ph [place] 20/20 -1 20/40 -1       Tonometry (Tonopen, 1:21 PM)      Right Left   Pressure 21 17       Pupils      Pupils Dark Light Shape React APD   Right PERRL 3 2 Round Brisk None   Left PERRL 3 2 Round Brisk None       Visual Fields (Counting fingers)      Left Right    Full Full       Extraocular Movement      Right Left    Full Full       Neuro/Psych    Oriented x3: Yes   Mood/Affect: Normal        Slit Lamp and Fundus Exam    External Exam      Right Left   External Normal Normal       Slit Lamp Exam      Right Left   Lids/Lashes Normal Normal   Conjunctiva/Sclera White and quiet White and quiet   Cornea Clear Clear   Anterior Chamber Deep and quiet Deep and quiet   Iris Round and reactive Round and reactive   Lens 2+ Nuclear sclerosis Posterior chamber intraocular lens   Anterior Vitreous Normal Normal       Fundus Exam      Right Left   Posterior Vitreous Normal vitrectomized, clear   Disc Normal Normal   C/D Ratio 0.35 0.25   Macula Epiretinal membrane, mild Normal   Vessels Normal Normal   Periphery Normal Good peripheral laser chorioretinal scars, retina detached          IMAGING AND PROCEDURES  Imaging and Procedures for 07/31/20  OCT, Retina - OU - Both Eyes       Right Eye Quality was good. Scan locations included subfoveal. Central Foveal Thickness: 339. Progression has been stable. Findings include epiretinal membrane.   Left Eye Scan locations included subfoveal. Central Foveal Thickness: 360.  Progression has been stable. Findings include epiretinal membrane.   Notes OS much less cystoid macular edema.  OD with minor nontopographically distorting epiretinal membrane  ASSESSMENT/PLAN:  Left epiretinal membrane Minor remnant, nasal to fovea, CME much improved after removal of ILM off the fovea as compared to 2018  Cystoid macular edema of left eye OS improved and stable, and controlled on topical ketorolac twice daily OS  Macular pucker, right eye The nature of macular pucker (epiretinal membrane ERM) was discussed with the patient as well as threshold criteria for vitrectomy surgery. I explained that in rare cases another surgery is needed to actually remove a second wrinkle should it regrow.  Most often, the epiretinal membrane and underlying wrinkled internal limiting membrane are removed with the first surgery, to accomplish the goals.   If the operative eye is Phakic (natural lens still present), cataract surgery is often recommended prior to Vitrectomy. This will enable the retina surgeon to have the best view during surgery and the patient to obtain optimal results in the future. Treatment options were discussed.  On distorting epiretinal membrane, no effect on acuity will observe  Age-related nuclear cataract of right eye The nature of cataract was discussed with the patient as well as the elective nature of surgery. The patient was reassured that surgery at a later date does not put the patient at risk for a worse outcome. It was emphasized that the need for surgery is dictated by the patient's quality of life as influenced by the cataract. Patient was instructed to maintain close follow up with their general eye care doctor.  ED with no visual impact to current nuclear sclerotic cataract      ICD-10-CM   1. Cystoid macular edema of left eye  H35.352 OCT, Retina - OU - Both Eyes    CANCELED: OCT, Retina - OS - Left Eye  2. Left epiretinal membrane   H35.372 OCT, Retina - OU - Both Eyes  3. Retinal detachment of left eye with giant retinal tear  H33.032   4. Macular pucker, right eye  H35.371   5. Age-related nuclear cataract of right eye  H25.11     1.  Is asked to continue on the topical ketorolac twice daily to simply control the minor cystoid macular edema, swelling in the back of the left eye  2.  Nondistorting epiretinal membrane of the right eye with good visual acuity, observe  3.  OS with small remnant of epiretinal membrane nasal to the foveal region, not pathologic at this time  4.  Minor cataract in the right eye, not visually impactful  Ophthalmic Meds Ordered this visit:  No orders of the defined types were placed in this encounter.      Return in about 6 months (around 01/28/2021) for DILATE OU, OCT.  There are no Patient Instructions on file for this visit.   Explained the diagnoses, plan, and follow up with the patient and they expressed understanding.  Patient expressed understanding of the importance of proper follow up care.   Clent Demark Asna Muldrow M.D. Diseases & Surgery of the Retina and Vitreous [facility]ina [facility] [place] 07/31/20     Abbreviations: M myopia (nearsighted); A astigmatism; H hyperopia (farsighted); P presbyopia; Mrx spectacle prescription;  CTL contact lenses; OD right eye; OS left eye; OU both eyes  XT exotropia; ET esotropia; PEK punctate epithelial keratitis; PEE punctate epithelial erosions; DES dry eye syndrome; MGD meibomian gland dysfunction; ATs artificial tears; PFAT's preservative free artificial tears; [place] nuclear sclerotic cataract; PSC posterior subcapsular cataract; ERM epi-retinal membrane; PVD posterior vitreous detachment; RD retinal detachment; DM diabetes mellitus; DR diabetic retinopathy; NPDR non-proliferative  diabetic retinopathy; PDR proliferative diabetic retinopathy; CSME clinically significant macular edema; DME diabetic macular edema; dbh dot blot hemorrhages; CWS  cotton wool spot; POAG primary open angle glaucoma; C/D cup-to-disc ratio; HVF humphrey visual field; GVF goldmann visual field; OCT optical coherence tomography; IOP intraocular pressure; BRVO Branch retinal vein occlusion; CRVO central retinal vein occlusion; CRAO central retinal artery occlusion; BRAO branch retinal artery occlusion; RT retinal tear; SB scleral buckle; PPV pars plana vitrectomy; VH Vitreous hemorrhage; PRP panretinal laser photocoagulation; IVK intravitreal kenalog; VMT vitreomacular traction; MH Macular hole;  NVD neovascularization of the disc; NVE neovascularization elsewhere; AREDS age related eye disease study; ARMD age related macular degeneration; POAG primary open angle glaucoma; EBMD epithelial/anterior basement membrane dystrophy; ACIOL anterior chamber intraocular lens; IOL intraocular lens; PCIOL posterior chamber intraocular lens; Phaco/IOL phacoemulsification with intraocular lens placement; [place] photorefractive keratectomy; LASIK laser assisted in situ keratomileusis; HTN hypertension; DM diabetes mellitus; COPD chronic obstructive pulmonary disease

## 2020-07-31 NOTE — Assessment & Plan Note (Signed)
Minor remnant, nasal to fovea, CME much improved after removal of ILM off the fovea as compared to 2018

## 2020-07-31 NOTE — Assessment & Plan Note (Signed)
The nature of macular pucker (epiretinal membrane ERM) was discussed with the patient as well as threshold criteria for vitrectomy surgery. I explained that in rare cases another surgery is needed to actually remove a second wrinkle should it regrow.  Most often, the epiretinal membrane and underlying wrinkled internal limiting membrane are removed with the first surgery, to accomplish the goals.   If the operative eye is Phakic (natural lens still present), cataract surgery is often recommended prior to Vitrectomy. This will enable the retina surgeon to have the best view during surgery and the patient to obtain optimal results in the future. Treatment options were discussed.  On distorting epiretinal membrane, no effect on acuity will observe

## 2020-08-01 DIAGNOSIS — R69 Illness, unspecified: Secondary | ICD-10-CM | POA: Diagnosis not present

## 2020-08-02 DIAGNOSIS — Z23 Encounter for immunization: Secondary | ICD-10-CM | POA: Diagnosis not present

## 2020-08-02 DIAGNOSIS — D3A090 Benign carcinoid tumor of the bronchus and lung: Secondary | ICD-10-CM | POA: Diagnosis not present

## 2020-08-02 DIAGNOSIS — G4733 Obstructive sleep apnea (adult) (pediatric): Secondary | ICD-10-CM | POA: Diagnosis not present

## 2020-08-02 DIAGNOSIS — B029 Zoster without complications: Secondary | ICD-10-CM | POA: Diagnosis not present

## 2020-08-02 DIAGNOSIS — I10 Essential (primary) hypertension: Secondary | ICD-10-CM | POA: Diagnosis not present

## 2020-08-02 DIAGNOSIS — K222 Esophageal obstruction: Secondary | ICD-10-CM | POA: Diagnosis not present

## 2020-08-02 DIAGNOSIS — Z125 Encounter for screening for malignant neoplasm of prostate: Secondary | ICD-10-CM | POA: Diagnosis not present

## 2020-08-02 DIAGNOSIS — H3322 Serous retinal detachment, left eye: Secondary | ICD-10-CM | POA: Diagnosis not present

## 2020-08-17 DIAGNOSIS — G4733 Obstructive sleep apnea (adult) (pediatric): Secondary | ICD-10-CM | POA: Diagnosis not present

## 2021-04-30 ENCOUNTER — Encounter (INDEPENDENT_AMBULATORY_CARE_PROVIDER_SITE_OTHER): Payer: Self-pay | Admitting: Ophthalmology

## 2021-04-30 ENCOUNTER — Ambulatory Visit (INDEPENDENT_AMBULATORY_CARE_PROVIDER_SITE_OTHER): Payer: Medicare HMO | Admitting: Ophthalmology

## 2021-04-30 ENCOUNTER — Other Ambulatory Visit: Payer: Self-pay

## 2021-04-30 DIAGNOSIS — H33032 Retinal detachment with giant retinal tear, left eye: Secondary | ICD-10-CM | POA: Diagnosis not present

## 2021-04-30 DIAGNOSIS — H35352 Cystoid macular degeneration, left eye: Secondary | ICD-10-CM | POA: Diagnosis not present

## 2021-04-30 DIAGNOSIS — H2511 Age-related nuclear cataract, right eye: Secondary | ICD-10-CM | POA: Diagnosis not present

## 2021-04-30 NOTE — Progress Notes (Signed)
04/30/2021     CHIEF COMPLAINT Patient presents for Retina Follow Up   HISTORY OF PRESENT ILLNESS: Harry Padilla is a 65 y.o. male who presents to the clinic today for:   HPI     Retina Follow Up           Diagnosis: Other   Laterality: both eyes   Onset: 9 months ago   Severity: mild   Duration: 9 months   Course: stable         Comments   9 month fu OU and OCT Pt states VA OU stable since last visit. Pt denies FOL, floaters, or ocular pain OU.  Pt states, "I have not noticed any real change. My left eye is still not great. I am able to drive some but I do not drive much." Pt reports using Ketorolac BID OS       Last edited by Kendra Opitz, COA on 04/30/2021  1:03 PM.      Referring physician: Jerrol Banana., MD [address] [place],  [place] [address]  HISTORICAL INFORMATION:   Selected notes from the MEDICAL RECORD NUMBER       CURRENT MEDICATIONS: Current Outpatient Medications (Ophthalmic Drugs)  Medication Sig   ketorolac (ACULAR) 0.5 % ophthalmic solution Place 1 drop into the left eye 4 (four) times daily.   No current facility-administered medications for this visit. (Ophthalmic Drugs)   Current Outpatient Medications (Other)  Medication Sig   amLODipine (NORVASC) 5 MG tablet TAKE 1 TABLET BY MOUTH EVERY DAY   aspirin EC 81 MG tablet Take 81 mg by mouth at bedtime.   loratadine (CLARITIN) 10 MG tablet Take 1 tablet (10 mg total) by mouth at bedtime as needed (seasonal allergies).   losartan (COZAAR) 100 MG tablet TAKE 1 TABLET BY MOUTH EVERY DAY.   Multiple Vitamin (MULTIVITAMIN WITH MINERALS) TABS tablet Take 1 tablet by mouth daily.   niacin 500 MG tablet Take 500 mg by mouth at bedtime.   PRESCRIPTION MEDICATION at bedtime. CPAP   vitamin E 400 UNIT capsule Take 400 Units by mouth at bedtime.    No current facility-administered medications for this visit. (Other)      REVIEW OF  SYSTEMS:    ALLERGIES Allergies  Allergen Reactions   Penicillins Rash     As child    PAST MEDICAL HISTORY Past Medical History:  Diagnosis Date   BPH (benign prostatic hyperplasia)    Cancer ([place]) 2013   carcenoid - lung   Enthesopathy    Esophagus disorder 02/10/08   stretched at [facility]   Hypercholesterolemia    Hypertension    Obesity    OSA (obstructive sleep apnea)    cpap     last study 10 yrs ago   Viral pneumonia    Past Surgical History:  Procedure Laterality Date   CATARACT EXTRACTION W/PHACO Left 11/25/2015   Procedure: CATARACT EXTRACTION PHACO AND INTRAOCULAR LENS PLACEMENT ([place]);  Surgeon: Ronnell Freshwater, MD;  Location: [facility];  Service: Ophthalmology;  Laterality: Left;  CPAP   COLONOSCOPY WITH PROPOFOL N/A 07/12/2019   Procedure: COLONOSCOPY WITH PROPOFOL;  Surgeon: Robert Bellow, MD;  Location: [facility];  Service: Endoscopy;  Laterality: N/A;   ESOPHAGOGASTRODUODENOSCOPY (EGD) WITH PROPOFOL N/A 07/12/2019   Procedure: ESOPHAGOGASTRODUODENOSCOPY (EGD) WITH PROPOFOL;  Surgeon: Robert Bellow, MD;  Location: [facility];  Service: Endoscopy;  Laterality: N/A;   EYE SURGERY     GAS  INSERTION Left 08/07/2015   Procedure: INSERTION OF GAS-C3F8;  Surgeon: Hurman Horn, MD;  Location: [facility];  Service: Ophthalmology;  Laterality: Left;   LASIK Bilateral 2002   LUNG CANCER SURGERY     PARS PLANA VITRECTOMY Left 08/07/2015   Procedure: PARS PLANA VITRECTOMY WITH 25 GAUGE ;  Surgeon: Hurman Horn, MD;  Location: [facility];  Service: Ophthalmology;  Laterality: Left;   PHOTOCOAGULATION WITH LASER Left 08/07/2015   Procedure: PHOTOCOAGULATION WITH LASER-ENDOLASER;  Surgeon: Hurman Horn, MD;  Location: [facility];  Service: Ophthalmology;  Laterality: Left;   VIDEO BRONCHOSCOPY  01/26/2012   Procedure: VIDEO BRONCHOSCOPY WITHOUT FLUORO;  Surgeon: Tanda Rockers, MD;  [facility];  Service: Cardiopulmonary;  Laterality:  Bilateral;   VIDEO BRONCHOSCOPY  02/04/2012   Procedure: VIDEO BRONCHOSCOPY;  Surgeon: Gaye Pollack, MD;  [facility];  Service: Thoracic;  Laterality: N/A;    FAMILY HISTORY Family History  Problem Relation Age of Onset   Heart disease Mother     SOCIAL HISTORY Social History   Tobacco Use   Smoking status: Never   Smokeless tobacco: Never  Vaping Use   Vaping Use: Never used  Substance Use Topics   Alcohol use: No   Drug use: No         OPHTHALMIC EXAM:  Base Eye Exam     Visual Acuity (ETDRS)       Right Left   Dist [place] 20/40 +2 20/100   Dist ph [place] 20/25 -1 20/60 +1         Tonometry (Tonopen, 1:06 PM)       Right Left   Pressure 12 12         Pupils       Pupils Dark Light Shape React APD   Right PERRL 3 2 Round Brisk None   Left PERRL 3 2 Round Brisk None         Visual Fields (Counting fingers)       Left Right    Full Full         Extraocular Movement       Right Left    Full Full         Neuro/Psych     Oriented x3: Yes   Mood/Affect: Normal         Dilation     Both eyes: 1.0% Mydriacyl, 2.5% Phenylephrine @ 1:06 PM           Slit Lamp and Fundus Exam     External Exam       Right Left   External Normal Normal         Slit Lamp Exam       Right Left   Lids/Lashes Normal Normal   Conjunctiva/Sclera White and quiet White and quiet   Cornea Clear Clear   Anterior Chamber Deep and quiet Deep and quiet   Iris Round and reactive Round and reactive   Lens 2+ Nuclear sclerosis Posterior chamber intraocular lens   Anterior Vitreous Normal Normal         Fundus Exam       Right Left   Posterior Vitreous Normal vitrectomized, clear   Disc Normal Normal   C/D Ratio 0.35 0.25   Macula Epiretinal membrane, mild Normal   Vessels Normal Normal   Periphery Normal Good peripheral laser chorioretinal scars, retina detached            IMAGING AND PROCEDURES  Imaging  and Procedures for  04/30/21  OCT, Retina - OU - Both Eyes       Right Eye Quality was good. Scan locations included subfoveal. Central Foveal Thickness: 350. Progression has been stable. Findings include epiretinal membrane.   Left Eye Scan locations included subfoveal. Central Foveal Thickness: 346. Progression has been stable. Findings include epiretinal membrane.   Notes OS much less cystoid macular edema. And less thickening over time  OD with minor nontopographically distorting epiretinal membrane             ASSESSMENT/PLAN:  Age-related nuclear cataract of right eye Moderate nuclear sclerotic cataract, no change over time  Retinal detachment of left eye with giant retinal tear No new findings left eye, no new breaks     ICD-10-CM   1. Cystoid macular edema of left eye  H35.352 OCT, Retina - OU - Both Eyes    2. Age-related nuclear cataract of right eye  H25.11     3. Retinal detachment of left eye with giant retinal tear  H33.032       1.  History of giant retinal tear detachment left eye status post surgical repair retina attached no new complications  2.  OS is pseudophakic and has a history of chronic recurrence of CME which has been curtailed and treated with topical NSAIDs and now has maintained and is stable on topical ketorolac twice daily long-term  3.  Moderate cataract right eye no progression  No retinal holes or tears right eye  Ophthalmic Meds Ordered this visit:  No orders of the defined types were placed in this encounter.      Return in about 1 year (around 04/30/2022) for DILATE OU, COLOR FP, OCT.  There are no Patient Instructions on file for this visit.   Explained the diagnoses, plan, and follow up with the patient and they expressed understanding.  Patient expressed understanding of the importance of proper follow up care.   Clent Demark Blayre Papania M.D. Diseases & Surgery of the Retina and Vitreous Retina & Diabetic [place] 04/30/21     Abbreviations: M myopia (nearsighted); A astigmatism; H hyperopia (farsighted); P presbyopia; Mrx spectacle prescription;  CTL contact lenses; OD right eye; OS left eye; OU both eyes  XT exotropia; ET esotropia; PEK punctate epithelial keratitis; PEE punctate epithelial erosions; DES dry eye syndrome; MGD meibomian gland dysfunction; ATs artificial tears; PFAT's preservative free artificial tears; [place] nuclear sclerotic cataract; PSC posterior subcapsular cataract; ERM epi-retinal membrane; PVD posterior vitreous detachment; RD retinal detachment; DM diabetes mellitus; DR diabetic retinopathy; NPDR non-proliferative diabetic retinopathy; PDR proliferative diabetic retinopathy; CSME clinically significant macular edema; DME diabetic macular edema; dbh dot blot hemorrhages; CWS cotton wool spot; POAG primary open angle glaucoma; C/D cup-to-disc ratio; HVF humphrey visual field; GVF goldmann visual field; OCT optical coherence tomography; IOP intraocular pressure; BRVO Branch retinal vein occlusion; CRVO central retinal vein occlusion; CRAO central retinal artery occlusion; BRAO branch retinal artery occlusion; RT retinal tear; SB scleral buckle; PPV pars plana vitrectomy; VH Vitreous hemorrhage; PRP panretinal laser photocoagulation; IVK intravitreal kenalog; VMT vitreomacular traction; MH Macular hole;  NVD neovascularization of the disc; NVE neovascularization elsewhere; AREDS age related eye disease study; ARMD age related macular degeneration; POAG primary open angle glaucoma; EBMD epithelial/anterior basement membrane dystrophy; ACIOL anterior chamber intraocular lens; IOL intraocular lens; PCIOL posterior chamber intraocular lens; Phaco/IOL phacoemulsification with intraocular lens placement; [place] photorefractive keratectomy; LASIK laser assisted in situ keratomileusis; HTN hypertension; DM diabetes mellitus; COPD chronic obstructive pulmonary disease

## 2021-04-30 NOTE — Assessment & Plan Note (Signed)
No new findings left eye, no new breaks

## 2021-04-30 NOTE — Assessment & Plan Note (Signed)
Moderate nuclear sclerotic cataract, no change over time

## 2021-06-30 ENCOUNTER — Other Ambulatory Visit (INDEPENDENT_AMBULATORY_CARE_PROVIDER_SITE_OTHER): Payer: Self-pay | Admitting: Ophthalmology

## 2021-07-09 ENCOUNTER — Ambulatory Visit: Payer: Medicare HMO | Admitting: Surgery

## 2021-07-16 ENCOUNTER — Ambulatory Visit: Payer: Medicare HMO | Admitting: Surgery

## 2021-07-29 ENCOUNTER — Other Ambulatory Visit: Payer: Self-pay | Admitting: Surgery

## 2021-07-29 DIAGNOSIS — Z902 Acquired absence of lung [part of]: Secondary | ICD-10-CM

## 2021-07-30 ENCOUNTER — Ambulatory Visit: Payer: Medicare HMO | Admitting: Surgery

## 2021-07-30 ENCOUNTER — Encounter: Payer: Self-pay | Admitting: Surgery

## 2021-07-30 ENCOUNTER — Other Ambulatory Visit: Payer: Self-pay

## 2021-07-30 ENCOUNTER — Ambulatory Visit
Admission: RE | Admit: 2021-07-30 | Discharge: 2021-07-30 | Disposition: A | Discharge status: Home / Self Care | Payer: Medicare HMO | Source: Ambulatory Visit | Attending: Surgery | Admitting: Surgery

## 2021-07-30 VITALS — BP 151/97 | HR 62 | Resp 18 | Ht 68.0 in | Wt 210.0 lb

## 2021-07-30 DIAGNOSIS — Z902 Acquired absence of lung [part of]: Secondary | ICD-10-CM

## 2021-08-04 ENCOUNTER — Encounter: Payer: Self-pay | Admitting: Surgery

## 2021-08-04 NOTE — Progress Notes (Signed)
    HPI:  The patient returns today for follow up status post right thoracotomy and right middle lobectomy on 02/04/2012 for a T1A, N0 low-grade neuroendocrine tumor.  He continues to feel well with no shortness of breath.  Denies any hemoptysis.  He has been watching his diet and lost some weight.  He remains physically active  Current Outpatient Medications  Medication Sig Dispense Refill   amLODipine (NORVASC) 5 MG tablet TAKE 1 TABLET BY MOUTH EVERY DAY 90 tablet 0   aspirin EC 81 MG tablet Take 81 mg by mouth at bedtime.     atorvastatin (LIPITOR) 40 MG tablet Take 40 mg by mouth daily.     ketorolac (ACULAR) 0.5 % ophthalmic solution INSTILL 1 DROP INTO THE LEFT EYE TWICE DAILY 5 mL 12   loratadine (CLARITIN) 10 MG tablet Take 1 tablet (10 mg total) by mouth at bedtime as needed (seasonal allergies). 30 tablet 5   losartan (COZAAR) 100 MG tablet TAKE 1 TABLET BY MOUTH EVERY DAY. 90 tablet 3   Multiple Vitamin (MULTIVITAMIN WITH MINERALS) TABS tablet Take 1 tablet by mouth daily.     PRESCRIPTION MEDICATION at bedtime. CPAP     vitamin E 400 UNIT capsule Take 400 Units by mouth at bedtime.      niacin 500 MG tablet Take 500 mg by mouth at bedtime. (Patient not taking: Reported on 07/30/2021)     No current facility-administered medications for this visit.     Physical Exam: BP (!) 151/97 (BP Location: Right Arm, Cuff Size: Normal)   Pulse 62   Resp 18   Ht 5\' 8"  (1.727 m)   Wt 210 lb (95.3 kg)   SpO2 97% Comment: on RA  BMI 31.93 kg/m  He looks well. There is no cervical or supraclavicular adenopathy. Lungs are clear. Cardiac exam shows regular rate and rhythm with normal heart sounds.  Diagnostic Tests:  Narrative & Impression  CLINICAL DATA:  Postop right lobectomy.   EXAM: CHEST - 2 VIEW   COMPARISON:  Radiographs 07/10/2020 and 07/05/2019.  CT 05/06/2016.   FINDINGS: There is stable volume loss in the right hemithorax status post right middle lobectomy. Right  infrahilar scarring and mild bibasilar atelectasis are unchanged. The heart size and mediastinal contours are stable. There is no pleural effusion or pneumothorax. The bones appear unchanged.   IMPRESSION: Stable postoperative chest.  No active cardiopulmonary process.     Electronically Signed   By: Richardean Sale M.D.   On: 07/30/2021 08:52    Impression:  He is 9 years out from resection of his lung tumor without evidence of recurrence.  I have recommended a yearly chest x-ray for surveillance.  Plan:  He will return to see me in 1 year with a chest x-ray.  I spent 10 minutes performing this established patient evaluation and > 50% of this time was spent face to face counseling and coordinating the surveillance of his previously resected lung tumor.  Gaye Pollack, MD [facility] [phone]

## 2022-05-04 ENCOUNTER — Encounter (INDEPENDENT_AMBULATORY_CARE_PROVIDER_SITE_OTHER): Payer: Medicare HMO | Admitting: Ophthalmology

## 2022-05-19 ENCOUNTER — Encounter (INDEPENDENT_AMBULATORY_CARE_PROVIDER_SITE_OTHER): Payer: Medicare HMO | Admitting: Ophthalmology

## 2022-06-10 ENCOUNTER — Encounter (INDEPENDENT_AMBULATORY_CARE_PROVIDER_SITE_OTHER): Payer: Self-pay | Admitting: Ophthalmology

## 2022-06-10 ENCOUNTER — Ambulatory Visit (INDEPENDENT_AMBULATORY_CARE_PROVIDER_SITE_OTHER): Payer: Medicare HMO | Admitting: Ophthalmology

## 2022-06-10 DIAGNOSIS — H35372 Puckering of macula, left eye: Secondary | ICD-10-CM | POA: Diagnosis not present

## 2022-06-10 DIAGNOSIS — H35371 Puckering of macula, right eye: Secondary | ICD-10-CM | POA: Diagnosis not present

## 2022-06-10 DIAGNOSIS — H33032 Retinal detachment with giant retinal tear, left eye: Secondary | ICD-10-CM

## 2022-06-10 DIAGNOSIS — H35352 Cystoid macular degeneration, left eye: Secondary | ICD-10-CM

## 2022-06-10 DIAGNOSIS — H2511 Age-related nuclear cataract, right eye: Secondary | ICD-10-CM

## 2022-06-10 NOTE — Assessment & Plan Note (Signed)
Chronic reactive CME now well-controlled and still slightly improved on ketorolac 1 drop left eye twice daily.

## 2022-06-10 NOTE — Progress Notes (Signed)
06/10/2022     CHIEF COMPLAINT Patient presents for  Chief Complaint  Patient presents with   Cystoid Macular Edema      HISTORY OF PRESENT ILLNESS: Harry Padilla is a 66 y.o. male who presents to the clinic today for:   HPI   1 YR FU OU OCT FP. Pt states vision has remained stable since last visit. Pt is currently taking Ketorolac- 1 drop into OS 2x daily.  Last edited by Silvestre Moment on 06/10/2022  8:21 AM.      Referring physician: Jerrol Banana., MD [address] [place],  [place] [address]  HISTORICAL INFORMATION:   Selected notes from the MEDICAL RECORD NUMBER       CURRENT MEDICATIONS: Current Outpatient Medications (Ophthalmic Drugs)  Medication Sig   ketorolac (ACULAR) 0.5 % ophthalmic solution INSTILL 1 DROP INTO THE LEFT EYE TWICE DAILY   No current facility-administered medications for this visit. (Ophthalmic Drugs)   Current Outpatient Medications (Other)  Medication Sig   amLODipine (NORVASC) 5 MG tablet TAKE 1 TABLET BY MOUTH EVERY DAY   aspirin EC 81 MG tablet Take 81 mg by mouth at bedtime.   atorvastatin (LIPITOR) 40 MG tablet Take 40 mg by mouth daily.   loratadine (CLARITIN) 10 MG tablet Take 1 tablet (10 mg total) by mouth at bedtime as needed (seasonal allergies).   losartan (COZAAR) 100 MG tablet TAKE 1 TABLET BY MOUTH EVERY DAY.   Multiple Vitamin (MULTIVITAMIN WITH MINERALS) TABS tablet Take 1 tablet by mouth daily.   niacin 500 MG tablet Take 500 mg by mouth at bedtime. (Patient not taking: Reported on 07/30/2021)   PRESCRIPTION MEDICATION at bedtime. CPAP   vitamin E 400 UNIT capsule Take 400 Units by mouth at bedtime.    No current facility-administered medications for this visit. (Other)      REVIEW OF SYSTEMS: ROS   Negative for: Constitutional, Gastrointestinal, Neurological, Skin, Genitourinary, Musculoskeletal, HENT, Endocrine, Cardiovascular, Eyes, Respiratory, Psychiatric, Allergic/Imm, Heme/Lymph Last  edited by Silvestre Moment on 06/10/2022  8:20 AM.       ALLERGIES Allergies  Allergen Reactions   Penicillins Rash     As child    PAST MEDICAL HISTORY Past Medical History:  Diagnosis Date   BPH (benign prostatic hyperplasia)    Cancer ([place]) 2013   carcenoid - lung   Enthesopathy    Esophagus disorder 02/10/08   stretched at [facility]   Hypercholesterolemia    Hypertension    Obesity    OSA (obstructive sleep apnea)    cpap     last study 10 yrs ago   Viral pneumonia    Past Surgical History:  Procedure Laterality Date   CATARACT EXTRACTION W/PHACO Left 11/25/2015   Procedure: CATARACT EXTRACTION PHACO AND INTRAOCULAR LENS PLACEMENT ([place]);  Surgeon: Ronnell Freshwater, MD;  Location: [facility];  Service: Ophthalmology;  Laterality: Left;  CPAP   COLONOSCOPY WITH PROPOFOL N/A 07/12/2019   Procedure: COLONOSCOPY WITH PROPOFOL;  Surgeon: Robert Bellow, MD;  Location: [facility];  Service: Endoscopy;  Laterality: N/A;   ESOPHAGOGASTRODUODENOSCOPY (EGD) WITH PROPOFOL N/A 07/12/2019   Procedure: ESOPHAGOGASTRODUODENOSCOPY (EGD) WITH PROPOFOL;  Surgeon: Robert Bellow, MD;  Location: [facility];  Service: Endoscopy;  Laterality: N/A;   EYE SURGERY     GAS INSERTION Left 08/07/2015   Procedure: INSERTION OF GAS-C3F8;  Surgeon: Hurman Horn, MD;  Location: [facility];  Service: Ophthalmology;  Laterality: Left;   LASIK Bilateral  2002   LUNG CANCER SURGERY     PARS PLANA VITRECTOMY Left 08/07/2015   Procedure: PARS PLANA VITRECTOMY WITH 25 GAUGE ;  Surgeon: Hurman Horn, MD;  Location: [facility];  Service: Ophthalmology;  Laterality: Left;   PHOTOCOAGULATION WITH LASER Left 08/07/2015   Procedure: PHOTOCOAGULATION WITH LASER-ENDOLASER;  Surgeon: Hurman Horn, MD;  Location: [facility];  Service: Ophthalmology;  Laterality: Left;   VIDEO BRONCHOSCOPY  01/26/2012   Procedure: VIDEO BRONCHOSCOPY WITHOUT FLUORO;  Surgeon: Tanda Rockers, MD;  [facility];  Service:  Cardiopulmonary;  Laterality: Bilateral;   VIDEO BRONCHOSCOPY  02/04/2012   Procedure: VIDEO BRONCHOSCOPY;  Surgeon: Gaye Pollack, MD;  [facility];  Service: Thoracic;  Laterality: N/A;    FAMILY HISTORY Family History  Problem Relation Age of Onset   Heart disease Mother     SOCIAL HISTORY Social History   Tobacco Use   Smoking status: Never   Smokeless tobacco: Never  Vaping Use   Vaping Use: Never used  Substance Use Topics   Alcohol use: No   Drug use: No         OPHTHALMIC EXAM:  Base Eye Exam     Visual Acuity (ETDRS)       Right Left   Dist [place] 20/30 -2 20/100   Dist ph [place] 20/15 20/40 -2         Tonometry (Tonopen, 8:27 AM)       Right Left   Pressure 14 15         Pupils       Pupils APD   Right PERRL None   Left PERRL None         Visual Fields       Left Right    Full Full         Extraocular Movement       Right Left    Full, Ortho Full, Ortho         Neuro/Psych     Oriented x3: Yes   Mood/Affect: Normal         Dilation     Both eyes: 1.0% Mydriacyl, 2.5% Phenylephrine @ 8:27 AM           Slit Lamp and Fundus Exam     External Exam       Right Left   External Normal Normal         Slit Lamp Exam       Right Left   Lids/Lashes Normal Normal   Conjunctiva/Sclera White and quiet White and quiet   Cornea Clear Clear   Anterior Chamber Deep and quiet Deep and quiet   Iris Round and reactive Round and reactive   Lens 2+ Nuclear sclerosis Posterior chamber intraocular lens   Anterior Vitreous Normal Normal         Fundus Exam       Right Left   Posterior Vitreous Normal vitrectomized, clear   Disc Normal Normal   C/D Ratio 0.35 0.25   Macula Epiretinal membrane, mild Normal   Vessels Normal Normal   Periphery Normal Good peripheral laser chorioretinal scars, retina detached            IMAGING AND PROCEDURES  Imaging and Procedures for 06/10/22  OCT, Retina - OU - Both Eyes        Right Eye Quality was good. Scan locations included subfoveal. Central Foveal Thickness: 358. Progression has been stable. Findings include epiretinal membrane.  Left Eye Scan locations included subfoveal. Central Foveal Thickness: 337. Progression has been stable. Findings include epiretinal membrane.   Notes OS much less cystoid macular edema. And less thickening over time  OD with minor nontopographically distorting epiretinal membrane      Color Fundus Photography Optos - OU - Both Eyes       Right Eye Progression has been stable.   Left Eye Progression has been stable. Disc findings include normal observations. Macula : normal observations.   Notes PVD OD,,,  NO HOLES OR TEARS              ASSESSMENT/PLAN:  Cystoid macular edema of left eye Chronic reactive CME now well-controlled and still slightly improved on ketorolac 1 drop left eye twice daily.  Left epiretinal membrane Minor nasal to the fovea no impact  Age-related nuclear cataract of right eye Moderate NSC with excellent acuity observed  Macular pucker, right eye OD, with thickening but no impact on acuity observe  Retinal detachment of left eye with giant retinal tear Repaired via vitrectomy laser and injection of vitreous substitute 2016 stable over time     ICD-10-CM   1. Cystoid macular edema of left eye  H35.352 OCT, Retina - OU - Both Eyes    Color Fundus Photography Optos - OU - Both Eyes    2. Left epiretinal membrane  H35.372     3. Age-related nuclear cataract of right eye  H25.11     4. Macular pucker, right eye  H35.371     5. Retinal detachment of left eye with giant retinal tear  H33.032       1.  OS doing well on chronic suppressive NSAID for minor CME.  Good acuity.  2.  You with ERM, not impactful acuity observe  3.  Minor [place] OD observe  Ophthalmic Meds Ordered this visit:  No orders of the defined types were placed in this encounter.      Return in  about 1 year (around 06/11/2023) for DILATE OU, COLOR FP, OCT.  There are no Patient Instructions on file for this visit.   Explained the diagnoses, plan, and follow up with the patient and they expressed understanding.  Patient expressed understanding of the importance of proper follow up care.   Clent Demark Dois Juarbe M.D. Diseases & Surgery of the Retina and Vitreous Retina & Diabetic [place] 06/10/22     Abbreviations: M myopia (nearsighted); A astigmatism; H hyperopia (farsighted); P presbyopia; Mrx spectacle prescription;  CTL contact lenses; OD right eye; OS left eye; OU both eyes  XT exotropia; ET esotropia; PEK punctate epithelial keratitis; PEE punctate epithelial erosions; DES dry eye syndrome; MGD meibomian gland dysfunction; ATs artificial tears; PFAT's preservative free artificial tears; [place] nuclear sclerotic cataract; PSC posterior subcapsular cataract; ERM epi-retinal membrane; PVD posterior vitreous detachment; RD retinal detachment; DM diabetes mellitus; DR diabetic retinopathy; NPDR non-proliferative diabetic retinopathy; PDR proliferative diabetic retinopathy; CSME clinically significant macular edema; DME diabetic macular edema; dbh dot blot hemorrhages; CWS cotton wool spot; POAG primary open angle glaucoma; C/D cup-to-disc ratio; HVF humphrey visual field; GVF goldmann visual field; OCT optical coherence tomography; IOP intraocular pressure; BRVO Branch retinal vein occlusion; CRVO central retinal vein occlusion; CRAO central retinal artery occlusion; BRAO branch retinal artery occlusion; RT retinal tear; SB scleral buckle; PPV pars plana vitrectomy; VH Vitreous hemorrhage; PRP panretinal laser photocoagulation; IVK intravitreal kenalog; VMT vitreomacular traction; MH Macular hole;  NVD neovascularization of the disc; NVE neovascularization elsewhere; AREDS age related  eye disease study; ARMD age related macular degeneration; POAG primary open angle glaucoma; EBMD epithelial/anterior  basement membrane dystrophy; ACIOL anterior chamber intraocular lens; IOL intraocular lens; PCIOL posterior chamber intraocular lens; Phaco/IOL phacoemulsification with intraocular lens placement; [place] photorefractive keratectomy; LASIK laser assisted in situ keratomileusis; HTN hypertension; DM diabetes mellitus; COPD chronic obstructive pulmonary disease

## 2022-06-10 NOTE — Assessment & Plan Note (Signed)
Moderate NSC with excellent acuity observed

## 2022-06-10 NOTE — Assessment & Plan Note (Signed)
Repaired via vitrectomy laser and injection of vitreous substitute 2016 stable over time

## 2022-06-10 NOTE — Assessment & Plan Note (Signed)
OD, with thickening but no impact on acuity observe

## 2022-06-10 NOTE — Assessment & Plan Note (Signed)
Minor nasal to the fovea no impact

## 2022-06-13 IMAGING — CR DG CHEST 2V
2 series · 2 of 2 positions shown · non-contrast
Comparison: July 05, 2019

CLINICAL DATA: Postoperative lobectomy

EXAM:
CHEST - 2 VIEW

[w chest pa]
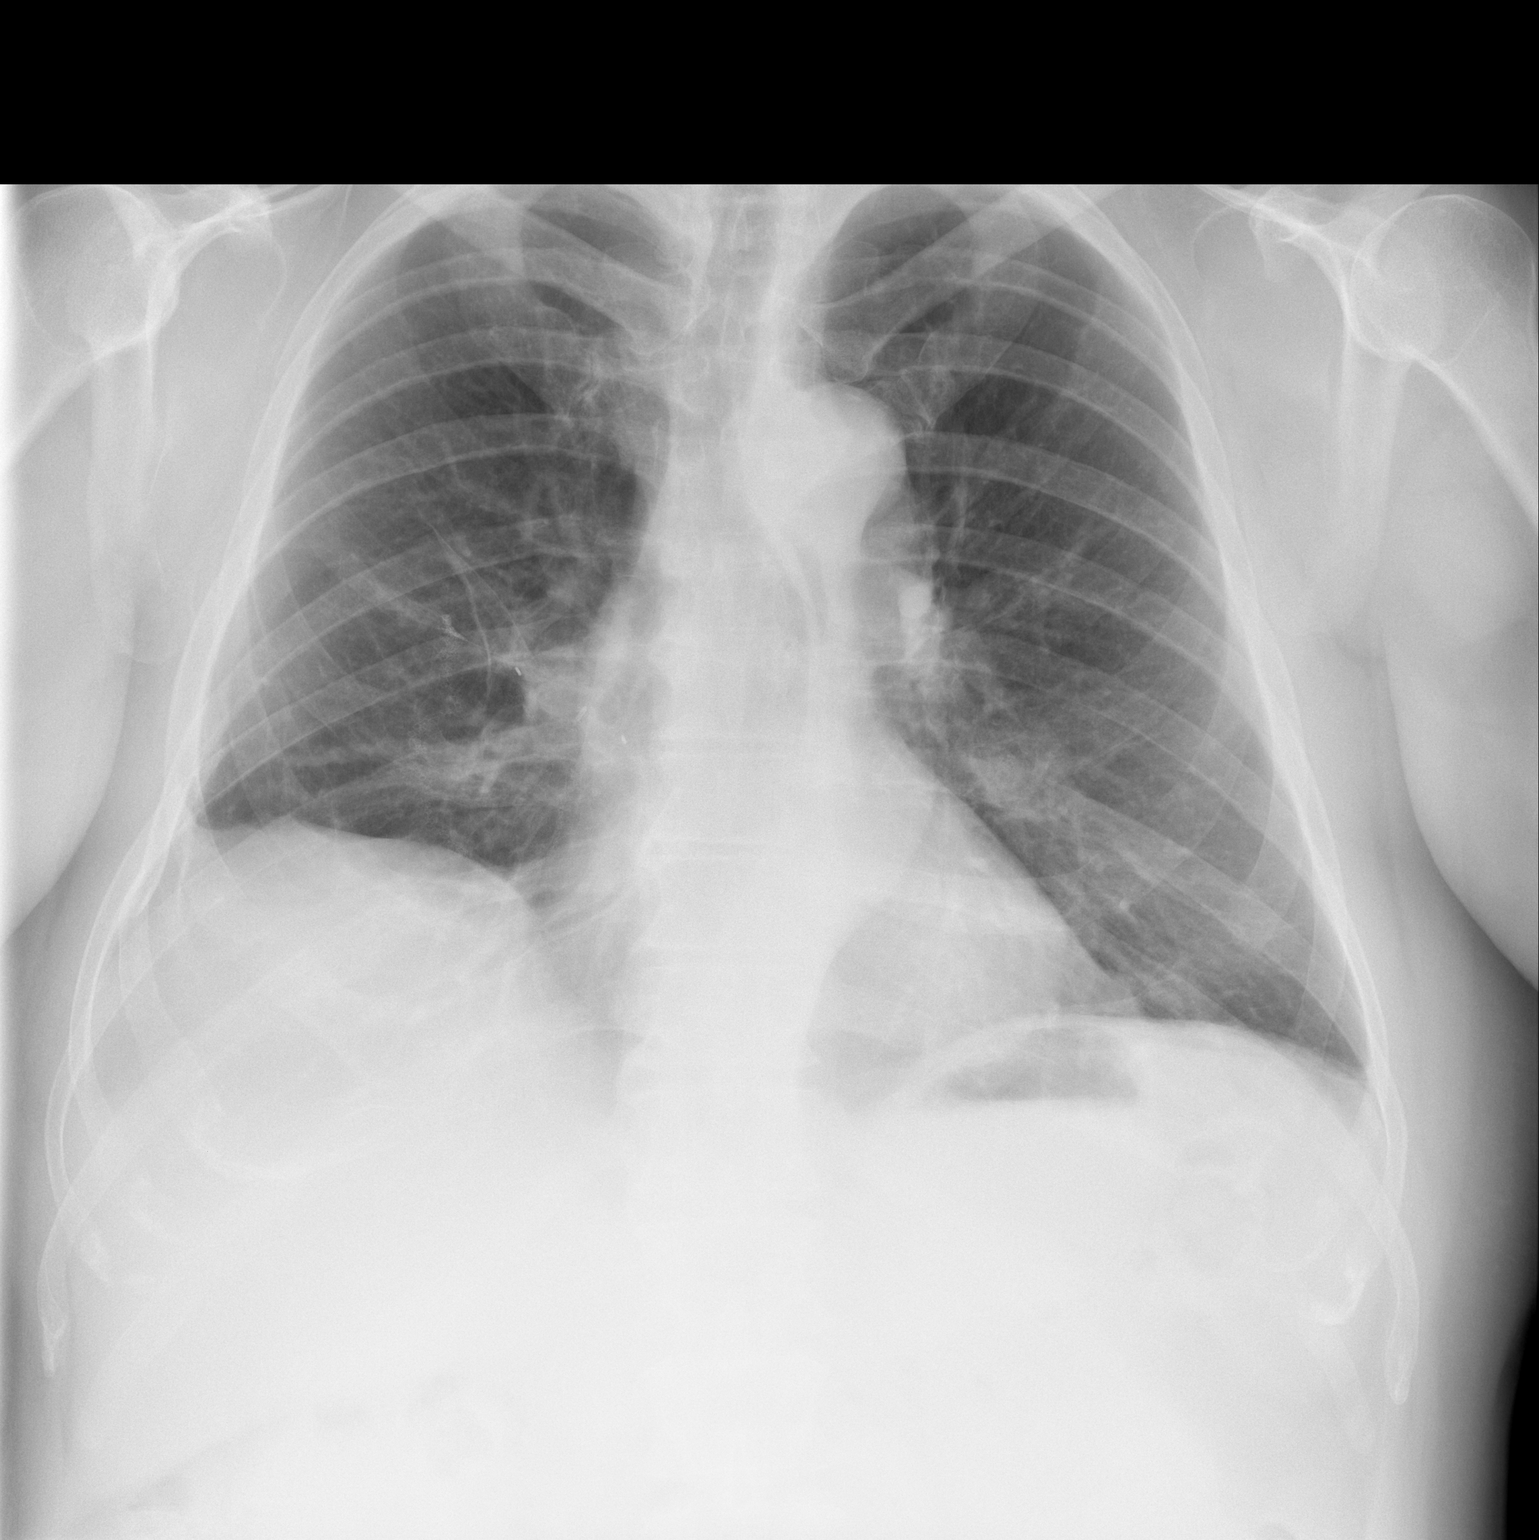

[w chest lat]
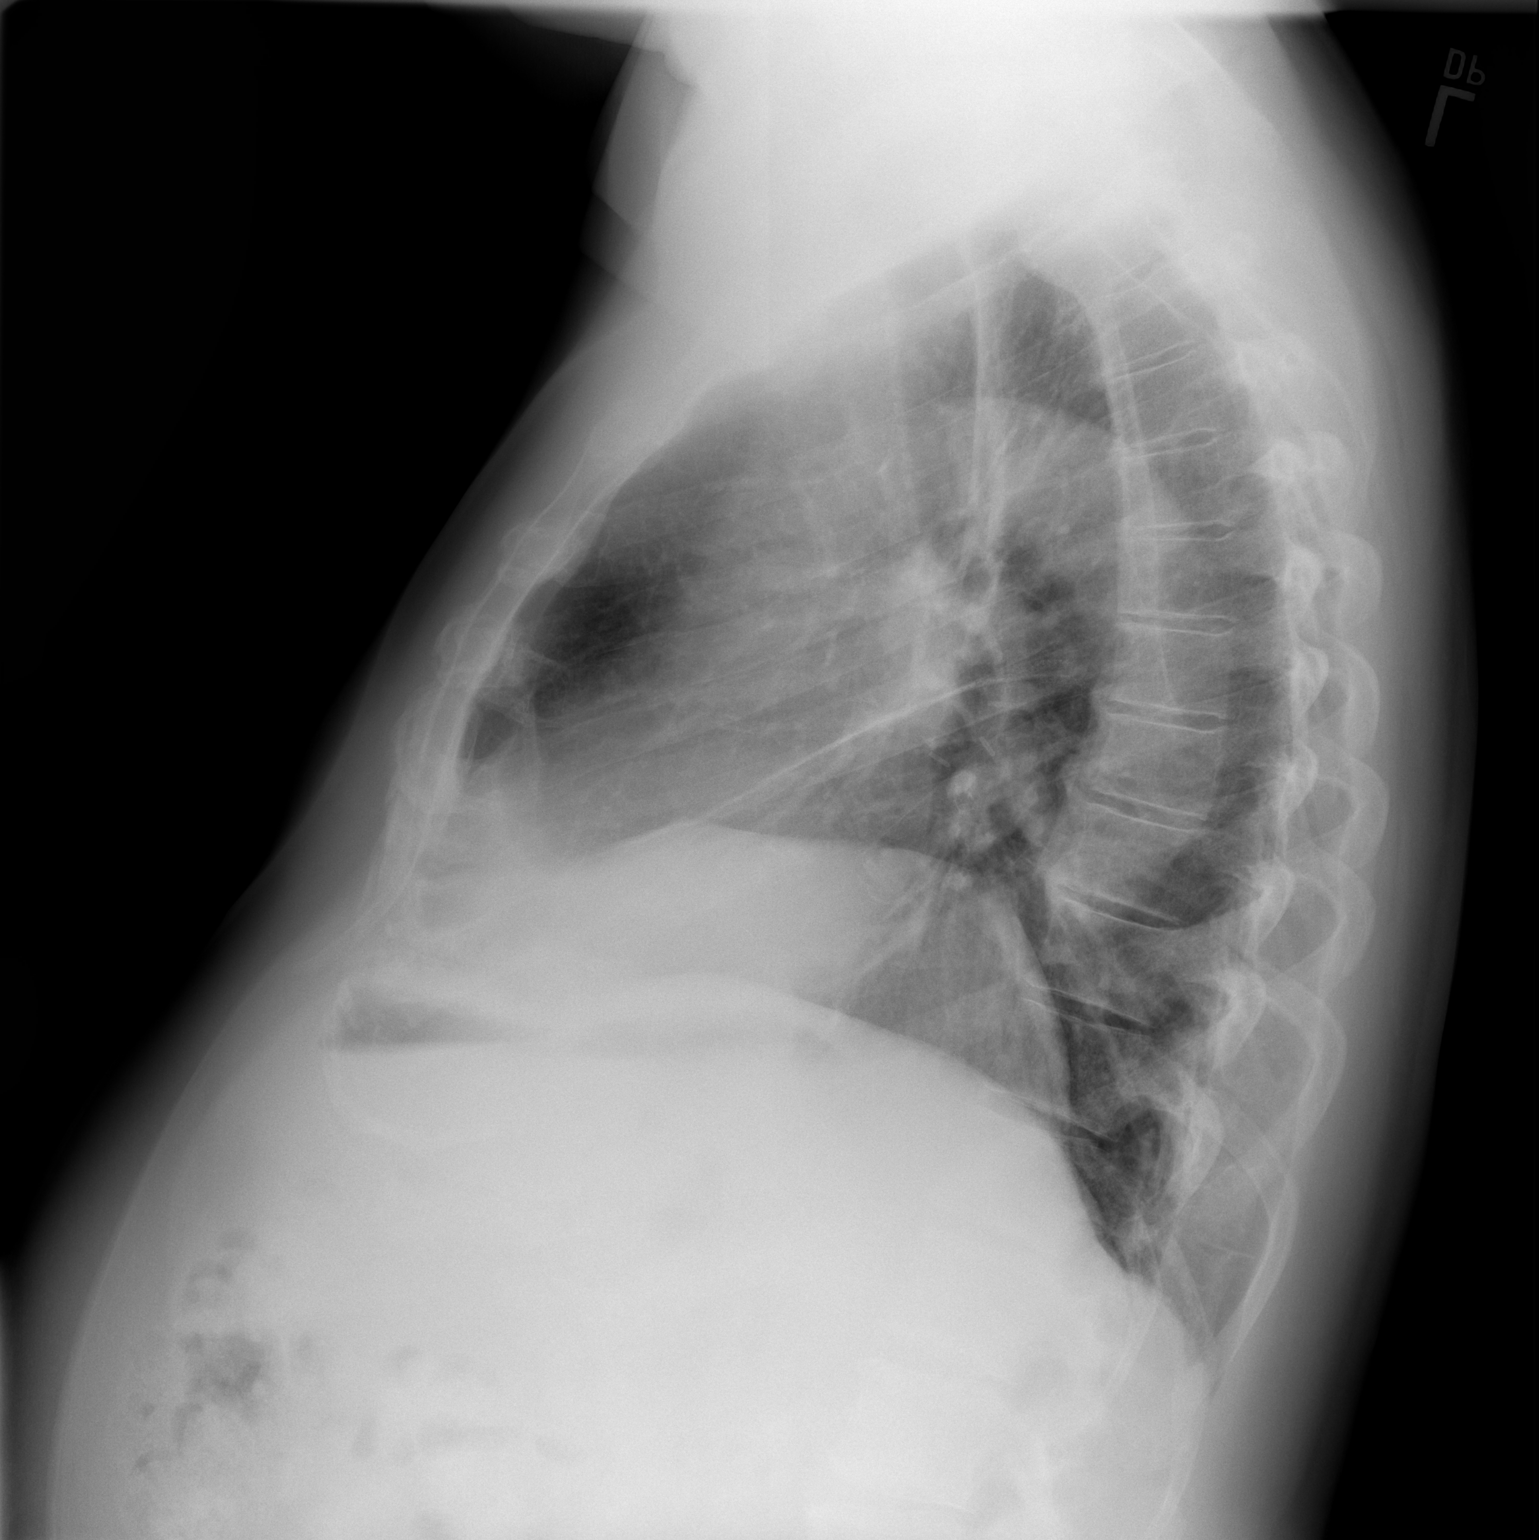

[2 of 2 positions shown; findings below may reference images not displayed]

FINDINGS: Postoperative scarring and volume loss on the right is stable. No
edema or airspace opacity. Heart size and pulmonary vascularity
normal. No adenopathy. No bone lesions.
IMPRESSION: Stable postoperative scarring and volume loss on the right. No new
opacity evident. Stable cardiac silhouette. No change from 1 year
prior.

## 2022-08-11 ENCOUNTER — Other Ambulatory Visit: Payer: Self-pay | Admitting: Surgery

## 2022-08-11 DIAGNOSIS — Z9889 Other specified postprocedural states: Secondary | ICD-10-CM

## 2022-08-12 ENCOUNTER — Ambulatory Visit: Payer: Medicare HMO | Admitting: Surgery

## 2022-08-12 ENCOUNTER — Ambulatory Visit
Admission: RE | Admit: 2022-08-12 | Discharge: 2022-08-12 | Disposition: A | Discharge status: Home / Self Care | Payer: Medicare HMO | Source: Ambulatory Visit | Attending: Surgery | Admitting: Surgery

## 2022-08-12 VITALS — BP 156/83 | HR 68 | Resp 20 | Wt 211.0 lb

## 2022-08-12 DIAGNOSIS — Z902 Acquired absence of lung [part of]: Secondary | ICD-10-CM

## 2022-08-12 DIAGNOSIS — Z85118 Personal history of other malignant neoplasm of bronchus and lung: Secondary | ICD-10-CM | POA: Diagnosis not present

## 2022-08-12 DIAGNOSIS — Z9889 Other specified postprocedural states: Secondary | ICD-10-CM

## 2022-08-12 NOTE — Progress Notes (Signed)
    HPI:  The patient returns today for follow up status post right thoracotomy and right middle lobectomy on 02/04/2012 for a T1A, N0 low-grade neuroendocrine tumor. He has had some cough from allergies and takes Claritin for that.  He denies hemoptysis. He had no shortness of breath. He denies any headaches.  He was seen by his PCP recently and noted to have hypertension and was started on additional medication for that.  He lives in [place].  Current Outpatient Medications  Medication Sig Dispense Refill   amLODipine (NORVASC) 5 MG tablet TAKE 1 TABLET BY MOUTH EVERY DAY 90 tablet 0   aspirin EC 81 MG tablet Take 81 mg by mouth at bedtime.     atorvastatin (LIPITOR) 40 MG tablet Take 40 mg by mouth daily.     hydrochlorothiazide (HYDRODIURIL) 12.5 MG tablet Take 1 tablet by mouth daily.     ketorolac (ACULAR) 0.5 % ophthalmic solution INSTILL 1 DROP INTO THE LEFT EYE TWICE DAILY 5 mL 12   loratadine (CLARITIN) 10 MG tablet Take 1 tablet (10 mg total) by mouth at bedtime as needed (seasonal allergies). 30 tablet 5   losartan (COZAAR) 100 MG tablet TAKE 1 TABLET BY MOUTH EVERY DAY. 90 tablet 3   Multiple Vitamins-Minerals (MULTIVITAMIN WITH MINERALS) tablet Take 1 tablet by mouth daily.     PRESCRIPTION MEDICATION at bedtime. CPAP     No current facility-administered medications for this visit.     Physical Exam: BP (!) 156/83 (BP Location: Left Arm, Patient Position: Sitting)   Pulse 68   Resp 20   Wt 211 lb (95.7 kg)   SpO2 97% Comment: RA  BMI 32.08 kg/m  He looks well. There is no cervical or supraclavicular adenopathy. Lungs are clear. Cardiac exam shows regular rate and rhythm with normal heart sounds.  There is no murmur.  Diagnostic Tests:  Narrative & Impression  CLINICAL DATA:  66 year old male status post right thoracotomy 10 years ago. Follow-up study.   EXAM: CHEST - 2 VIEW   COMPARISON:  Chest x-ray 07/30/2021.   FINDINGS: Postoperative changes of prior  right middle lobectomy are again noted with chronic elevation of the right hemidiaphragm and mild postoperative scarring in the lower right hemithorax. No suspicious appearing pulmonary nodules or masses are noted. No acute consolidative airspace disease. No pleural effusions. No pneumothorax. No evidence of pulmonary edema. Heart size is normal. Upper mediastinal contours are within normal limits. Atherosclerotic calcifications are noted in the thoracic aorta.   IMPRESSION: 1. Stable appearance of the chest following prior right middle lobectomy. 2. Aortic atherosclerosis.     Electronically Signed   By: Vinnie Langton M.D.   On: 08/12/2022 09:00        Impression:  He is now 10 years out from resection of his lung tumor without evidence of recurrence.  I reviewed his chest x-ray with him and answered his questions.  I recommended that he have a yearly chest x-ray for surveillance.  He would like to continue seeing me in [place] for that.  Plan:  I will see him back in 1 year with a chest x-ray.  I spent 10 minutes performing this established patient evaluation and > 50% of this time was spent face to face counseling and coordinating the surveillance of his previously resected lung tumor.   Gaye Pollack, MD [facility] [phone]

## 2023-06-15 ENCOUNTER — Encounter (INDEPENDENT_AMBULATORY_CARE_PROVIDER_SITE_OTHER): Payer: Medicare HMO | Admitting: Ophthalmology

## 2023-08-11 ENCOUNTER — Ambulatory Visit
Admission: RE | Admit: 2023-08-11 | Discharge: 2023-08-11 | Disposition: A | Discharge status: Home / Self Care | Payer: Medicare HMO | Source: Ambulatory Visit | Attending: Surgery | Admitting: Surgery

## 2023-08-11 ENCOUNTER — Other Ambulatory Visit: Payer: Self-pay | Admitting: Surgery

## 2023-08-11 ENCOUNTER — Ambulatory Visit (INDEPENDENT_AMBULATORY_CARE_PROVIDER_SITE_OTHER): Payer: Medicare HMO | Admitting: Surgery

## 2023-08-11 ENCOUNTER — Ambulatory Visit: Payer: Medicare HMO | Admitting: Surgery

## 2023-08-11 ENCOUNTER — Encounter: Payer: Self-pay | Admitting: Surgery

## 2023-08-11 VITALS — BP 134/74 | HR 64 | Resp 20 | Ht 68.0 in | Wt 214.0 lb

## 2023-08-11 DIAGNOSIS — Z85118 Personal history of other malignant neoplasm of bronchus and lung: Secondary | ICD-10-CM

## 2023-08-13 NOTE — Progress Notes (Signed)
    HPI:  The patient returns today for follow up status post right thoracotomy and right middle lobectomy on 02/04/2012 for a T1A, N0 low-grade neuroendocrine tumor. He had no shortness of breath. He denies any headaches. 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  Current Outpatient Medications  Medication Sig Dispense Refill   amLODipine (NORVASC) 5 MG tablet TAKE 1 TABLET BY MOUTH EVERY DAY 90 tablet 0   aspirin EC 81 MG tablet Take 81 mg by mouth at bedtime.     atorvastatin (LIPITOR) 40 MG tablet Take 40 mg by mouth daily.     hydrochlorothiazide (HYDRODIURIL) 12.5 MG tablet Take 1 tablet by mouth daily.     ketorolac (ACULAR) 0.5 % ophthalmic solution INSTILL 1 DROP INTO THE LEFT EYE TWICE DAILY 5 mL 12   loratadine (CLARITIN) 10 MG tablet Take 1 tablet (10 mg total) by mouth at bedtime as needed (seasonal allergies). 30 tablet 5   losartan (COZAAR) 100 MG tablet TAKE 1 TABLET BY MOUTH EVERY DAY. 90 tablet 3   Multiple Vitamins-Minerals (MULTIVITAMIN WITH MINERALS) tablet Take 1 tablet by mouth daily.     PRESCRIPTION MEDICATION at bedtime. CPAP     No current facility-administered medications for this visit.     Physical Exam: BP 134/74   Pulse 64   Resp 20   Ht 5\' 8"  (1.727 m)   Wt 214 lb (97.1 kg)   SpO2 96% Comment: RA  BMI 32.54 kg/m  He looks well. There is no cervical or supraclavicular adenopathy. Cardiac exam shows regular rate and rhythm with normal heart sounds.  There is no murmur. Lungs are clear.  Diagnostic Tests:  Narrative & Impression  CLINICAL DATA:  Lung tumor 10 years ago.  Surgery.   EXAM: CHEST - 2 VIEW   COMPARISON:  Chest radiographs 08/12/2022, 07/30/2021; CT chest 05/06/2016   FINDINGS: Cardiac silhouette and mediastinal contours are unchanged and within normal limits with mildly tortuous descending thoracic aorta again seen. Right-sided volume loss is again seen status post right middle lobectomy.   There is  associated mild-to-moderate elevation of the hemidiaphragm. Unchanged chronic blunting of right costophrenic angle, scarring. No acute airspace opacity. Mild left costophrenic angle linear scarring is unchanged. No pleural effusion or pneumothorax.   No acute skeletal abnormality.   IMPRESSION: 1. No acute cardiopulmonary process. 2. Unchanged right-sided volume loss status post right middle lobectomy.     Electronically Signed   By: Neita Garnet M.D.   On: 08/11/2023 16:01      Impression:  There is no sign of recurrent tumor 11-1/2 years following his surgery.  His was a low-grade neuroendocrine tumor which will have a low risk of recurrence.  I reviewed the chest x-ray with him and recommended continued yearly chest x-ray imaging.  He would like to return here to see me for that.  Plan:  I will see him back in 1 year with a chest x-ray.  I spent 10 minutes performing this established patient evaluation and > 50% of this time was spent face to face counseling and coordinating the surveillance of his previously resected lung tumor.  Alleen Borne, MD [facility] [phone]

## 2023-08-19 ENCOUNTER — Ambulatory Visit: Payer: Medicare HMO | Admitting: Surgery

## 2024-03-31 ENCOUNTER — Encounter: Payer: Self-pay | Admitting: Advanced Practice Midwife

## 2024-08-08 ENCOUNTER — Other Ambulatory Visit: Payer: Self-pay | Admitting: Surgery

## 2024-08-08 DIAGNOSIS — Z9889 Other specified postprocedural states: Secondary | ICD-10-CM

## 2024-08-09 ENCOUNTER — Ambulatory Visit (HOSPITAL_COMMUNITY)
Admission: RE | Admit: 2024-08-09 | Discharge: 2024-08-09 | Disposition: A | Discharge status: Home / Self Care | Source: Ambulatory Visit | Attending: Cardiology | Admitting: Cardiology

## 2024-08-09 ENCOUNTER — Ambulatory Visit: Attending: Surgery | Admitting: Surgery

## 2024-08-09 ENCOUNTER — Encounter: Payer: Self-pay | Admitting: Surgery

## 2024-08-09 VITALS — BP 151/79 | HR 60 | Resp 18 | Ht 68.0 in | Wt 220.0 lb

## 2024-08-09 DIAGNOSIS — Z902 Acquired absence of lung [part of]: Secondary | ICD-10-CM | POA: Diagnosis not present

## 2024-08-09 DIAGNOSIS — Z9889 Other specified postprocedural states: Secondary | ICD-10-CM | POA: Insufficient documentation

## 2024-08-09 NOTE — Progress Notes (Signed)
   [address] [place] [address]             [phone]    HPI:  The patient returns today for follow up status post right thoracotomy and right middle lobectomy on 02/04/2012 for a T1A, N0 low-grade neuroendocrine tumor. He had no shortness of breath. He denies any headaches. 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    Current Outpatient Medications  Medication Sig Dispense Refill   amLODipine  (NORVASC) 5 MG tablet TAKE 1 TABLET BY MOUTH EVERY DAY 90 tablet 0   aspirin EC 81 MG tablet Take 81 mg by mouth at bedtime.     atorvastatin (LIPITOR) 40 MG tablet Take 40 mg by mouth daily.     hydrochlorothiazide (HYDRODIURIL) 12.5 MG tablet Take 1 tablet by mouth daily.     ketorolac  (ACULAR ) 0.5 % ophthalmic solution INSTILL 1 DROP INTO THE LEFT EYE TWICE DAILY 5 mL 12   loratadine  (CLARITIN ) 10 MG tablet Take 1 tablet (10 mg total) by mouth at bedtime as needed (seasonal allergies). 30 tablet 5   losartan  (COZAAR ) 100 MG tablet TAKE 1 TABLET BY MOUTH EVERY DAY. 90 tablet 3   Multiple Vitamins-Minerals (MULTIVITAMIN WITH MINERALS) tablet Take 1 tablet by mouth daily.     PRESCRIPTION MEDICATION at bedtime. CPAP     No current facility-administered medications for this visit.     Physical Exam: BP (!) 151/79 (BP Location: Left Arm)   Pulse 60   Resp 18   Ht 5' 8 (1.727 m)   Wt 220 lb (99.8 kg)   SpO2 94%   BMI 33.45 kg/m  He looks well. There is no cervical or supraclavicular adenopathy. Cardiac exam shows regular rate and rhythm with normal heart sounds.  There is no murmur. Lungs are clear.  Diagnostic Tests:  Narrative & Impression  CLINICAL DATA:  Carcinoid tumor   EXAM: CHEST - 2 VIEW   COMPARISON:  08/11/2023   FINDINGS: Frontal and lateral views of the chest demonstrate a stable cardiac silhouette. Postsurgical changes are seen from partial right lung resection. Stable elevation of the right hemidiaphragm. No  airspace disease, effusion, or pneumothorax. No acute bony abnormalities.   IMPRESSION: 1. Stable postsurgical changes in the right hemithorax. 2. No acute intrathoracic process.     Electronically Signed   By: Ozell Daring M.D.   On: 08/09/2024 15:04      There is no sign of recurrent tumor 12-1/2 years following his surgery.  His was a low-grade neuroendocrine tumor which will have a low risk of recurrence.  I reviewed the chest x-ray with him and recommended continued yearly chest x-ray imaging.  He would like to return here to see me for that.   Plan:   I will see him back in 1 year with a chest x-ray.   I spent 10 minutes performing this established patient evaluation and > 50% of this time was spent face to face counseling and coordinating the surveillance of his previously resected lung tumor.   Dorise MARLA Fellers, MD [facility] [phone]

## 2025-08-01 ENCOUNTER — Ambulatory Visit: Admitting: Surgery
